# Patient Record
Sex: Male | Born: 1995 | Race: White | Hispanic: No | Marital: Single | Smoking: Never smoker
Health system: Southern US, Community
[De-identification: ages and names within clinical notes are randomized; demographics above are authoritative.]

## PROBLEM LIST (undated history)

## (undated) DIAGNOSIS — F840 Autistic disorder: Secondary | ICD-10-CM

## (undated) DIAGNOSIS — E7800 Pure hypercholesterolemia, unspecified: Secondary | ICD-10-CM

## (undated) DIAGNOSIS — Z8489 Family history of other specified conditions: Secondary | ICD-10-CM

## (undated) DIAGNOSIS — K0530 Chronic periodontitis, unspecified: Secondary | ICD-10-CM

## (undated) DIAGNOSIS — F419 Anxiety disorder, unspecified: Secondary | ICD-10-CM

## (undated) DIAGNOSIS — R231 Pallor: Secondary | ICD-10-CM

## (undated) DIAGNOSIS — K011 Impacted teeth: Secondary | ICD-10-CM

## (undated) DIAGNOSIS — K760 Fatty (change of) liver, not elsewhere classified: Secondary | ICD-10-CM

## (undated) HISTORY — PX: NO PAST SURGERIES: SHX2092

---
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
## 2013-02-27 ENCOUNTER — Emergency Department (HOSPITAL_BASED_OUTPATIENT_CLINIC_OR_DEPARTMENT_OTHER)
Admission: EM | Admit: 2013-02-27 | Discharge: 2013-02-27 | Disposition: A | Discharge status: Home / Self Care | Payer: 59 | Attending: Emergency Medicine | Admitting: Emergency Medicine

## 2013-02-27 ENCOUNTER — Emergency Department (HOSPITAL_BASED_OUTPATIENT_CLINIC_OR_DEPARTMENT_OTHER): Payer: 59

## 2013-02-27 ENCOUNTER — Encounter (HOSPITAL_BASED_OUTPATIENT_CLINIC_OR_DEPARTMENT_OTHER): Payer: Self-pay | Admitting: Emergency Medicine

## 2013-02-27 DIAGNOSIS — F411 Generalized anxiety disorder: Secondary | ICD-10-CM | POA: Insufficient documentation

## 2013-02-27 DIAGNOSIS — R109 Unspecified abdominal pain: Secondary | ICD-10-CM

## 2013-02-27 DIAGNOSIS — K3189 Other diseases of stomach and duodenum: Secondary | ICD-10-CM | POA: Insufficient documentation

## 2013-02-27 DIAGNOSIS — Z880 Allergy status to penicillin: Secondary | ICD-10-CM | POA: Insufficient documentation

## 2013-02-27 DIAGNOSIS — R630 Anorexia: Secondary | ICD-10-CM | POA: Insufficient documentation

## 2013-02-27 DIAGNOSIS — F840 Autistic disorder: Secondary | ICD-10-CM | POA: Insufficient documentation

## 2013-02-27 DIAGNOSIS — Z79899 Other long term (current) drug therapy: Secondary | ICD-10-CM | POA: Insufficient documentation

## 2013-02-27 DIAGNOSIS — R110 Nausea: Secondary | ICD-10-CM | POA: Insufficient documentation

## 2013-02-27 HISTORY — DX: Anxiety disorder, unspecified: F41.9

## 2013-02-27 HISTORY — DX: Autistic disorder: F84.0

## 2013-02-27 LAB — RAPID STREP SCREEN (MED CTR MEBANE ONLY): Streptococcus, Group A Screen (Direct): NEGATIVE

## 2013-02-27 NOTE — ED Notes (Signed)
Generalized abdominal pain since Tuesday, decreased po intake since Tuesday.

## 2013-02-27 NOTE — ED Provider Notes (Signed)
Medical screening examination/treatment/procedure(s) were performed by non-physician practitioner and as supervising physician I was immediately available for consultation/collaboration.  EKG Interpretation   None         Rolan Bucco, MD 02/27/13 1924

## 2013-02-27 NOTE — ED Provider Notes (Signed)
CSN: [id number]     Arrival date & time 02/27/13  1712 History   First MD Initiated Contact with Patient 02/27/13 1714     Chief Complaint  Patient presents with  . Abdominal Pain   (Consider location/radiation/quality/duration/timing/severity/associated sxs/prior Treatment) HPI Pt is an 17yo male hx of autism BIB mother c/o abdominal pain, decreased PO intake and fatigue.  Hx from pt is limited as he changes his answers with the same questions asked.  Pt's mother states he was c/o stomach pain on Tuesday, 11/5 and did not eat much all day. Pt seemed to sleep more than usual.  Wednesday the pt did the same.  Yesterday, he did go to school and had a "go-urt" but did not eat much the rest of the day and still sleep more than usual.  Pt saw his psychiatrist who informed mother he did not seem himself, he should be seen by a medical provider. Pt was seen at [facility] who advised mother to bring child to ED as he was c/o left sided abdominal pain.  Pt is denying pain at this time. Denies fever, n/v/d.  Mom believes pt may be making it up, thinking he is in 5th grade again as he has made several references about 5th grade recently , however, she is unsure. Denies hx of abdominal surgeries.  Past Medical History  Diagnosis Date  . Anxiety   . Autism    History reviewed. No pertinent past surgical history. History reviewed. No pertinent family history. History  Substance Use Topics  . Smoking status: Never Smoker   . Smokeless tobacco: Not on file  . Alcohol Use: No    Review of Systems  Constitutional: Negative for fever and chills.  Gastrointestinal: Positive for nausea and abdominal pain. Negative for vomiting and diarrhea.  Genitourinary: Negative for dysuria.  All other systems reviewed and are negative.    Allergies  Penicillins  Home Medications   Current Outpatient Rx  Name  Route  Sig  Dispense  Refill  . escitalopram (LEXAPRO) 10 MG tablet   Oral   Take 15 mg by mouth  daily.         . Multiple Vitamin (MULTIVITAMIN) tablet   Oral   Take 1 tablet by mouth daily.          BP 135/93  Pulse 96  Temp(Src) 97.5 F (36.4 C) (Oral)  Resp 18  Ht 5\' 10"  (1.778 m)  Wt 167 lb (75.751 kg)  BMI 23.96 kg/m2  SpO2 97% Physical Exam  Nursing note and vitals reviewed. Constitutional: He appears well-developed and well-nourished.  Pt lying on exam bed. NAD.  HENT:  Head: Normocephalic and atraumatic.  Eyes: Conjunctivae are normal. No scleral icterus.  Neck: Normal range of motion.  Cardiovascular: Normal rate, regular rhythm and normal heart sounds.   Pulmonary/Chest: Effort normal and breath sounds normal. No respiratory distress. He has no wheezes. He has no rales. He exhibits no tenderness.  Abdominal: Soft. He exhibits no distension and no mass. Bowel sounds are increased. There is no tenderness. There is no rebound and no guarding.  Increased bowel sounds. Soft, non-distended, non-tender.  Musculoskeletal: Normal range of motion.  Neurological: He is alert.  Skin: Skin is warm and dry.    ED Course  Procedures (including critical care time) Labs Review Labs Reviewed  RAPID STREP SCREEN  CULTURE, GROUP A STREP   Imaging Review Dg Abd 1 View  02/27/2013   CLINICAL DATA:  Abdominal pain  EXAM:  ABDOMEN - 1 VIEW  COMPARISON:  None.  FINDINGS: Upper abdomen is excluded. The visualized bowel gas pattern is normal. No radio-opaque calculi or other significant radiographic abnormality are seen.  IMPRESSION: Negative.   Electronically Signed   By: Oley Balm M.D.   On: 02/27/2013 17:49    EKG Interpretation   None       MDM   1. Stomach pain   2. Decreased appetite    Pt is 16yo male hx of autism c/o intermittent abdominal pain.  Mom reports increased sleeping and decreased PO in take.  Abd: increased bowel sounds, soft, NDNT.  Not concerned for surgical abdomen.  Will get KUB to check for possible constipation.  Will also get rapid strep  as pt mentioned to mother sore throat at one point.    Abd: no acute findings. Rapid strep: negative.  All labs/imaging/findings discussed with patient. All questions answered and concerns addressed. Will discharge pt home and have pt f/u with Dr. Eddie Candle. Return precautions given. Pt's mother verbalized understanding and agreement with tx plan. Vitals: unremarkable. Discharged in stable condition.    Discussed pt with attending during ED encounter and agrees with plan.     Junius Finner, PA-C 02/27/13 [phone]

## 2013-03-01 LAB — CULTURE, GROUP A STREP

## 2015-12-23 DIAGNOSIS — K0530 Chronic periodontitis, unspecified: Secondary | ICD-10-CM

## 2015-12-23 DIAGNOSIS — K011 Impacted teeth: Secondary | ICD-10-CM

## 2015-12-23 HISTORY — DX: Impacted teeth: K01.1

## 2015-12-23 HISTORY — DX: Chronic periodontitis, unspecified: K05.30

## 2015-12-28 ENCOUNTER — Encounter (HOSPITAL_BASED_OUTPATIENT_CLINIC_OR_DEPARTMENT_OTHER): Payer: Self-pay | Admitting: *Deleted

## 2015-12-29 ENCOUNTER — Encounter (HOSPITAL_BASED_OUTPATIENT_CLINIC_OR_DEPARTMENT_OTHER): Payer: Self-pay | Admitting: *Deleted

## 2015-12-29 NOTE — H&P (Signed)
  This is a 20 y/o wd/wn autistic white male.  He presents with four impacted third molars.  Indications for removal have been discussed at length.  Possible paresthesia to the  Lip tongue and chin were discussed.  Vital signs were checked.  Because of his autism and the difficulty of his surgery removal of his third molars under general anesthesia was recommended.

## 2015-12-29 NOTE — H&P (Signed)
Raymond Haynes is an 20 y.o. male.   Chief Complaint: impacted third molars  HPI: present for at least 4 years  Past Medical History:  Diagnosis Date  . Anxiety   . Autism    difficulties with food textures, per mother  . Family history of adverse reaction to anesthesia    states father woke up during wisdom tooth extraction  . Fatty liver   . High cholesterol   . Impacted third molar tooth 12/2015  . Pale complexion    mother states is fair-skin and strawberry blond hair  . 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 NO PAST SURGERIES      Family History  Problem Relation Age of Onset  . Anesthesia problems Father     woke up during wisdom tooth extraction   Social History:  reports that he has never smoked. He has never used smokeless tobacco. He reports that he does not drink alcohol or use drugs.  Allergies:  Allergies  Allergen Reactions  . Penicillins Rash    No prescriptions prior to admission.    No results found for this or any previous visit (from the past 48 hour(s)). No results found.  ROS  Height 5' 10.5" (1.791 m), weight 86.2 kg (190 lb). Physical Exam  HENT:  Mouth/Throat: Uvula is midline, oropharynx is clear and moist and mucous membranes are normal.       Assessment/Plan Four impacted third molar/autism  Raymond Haynes,JOSEPH L, DDS 12/29/2015, 4:56 PM

## 2016-01-04 ENCOUNTER — Ambulatory Visit (HOSPITAL_BASED_OUTPATIENT_CLINIC_OR_DEPARTMENT_OTHER): Payer: 59 | Admitting: Anesthesiology

## 2016-01-04 ENCOUNTER — Encounter (HOSPITAL_BASED_OUTPATIENT_CLINIC_OR_DEPARTMENT_OTHER)
Admission: RE | Disposition: A | Discharge status: Home / Self Care | Payer: Self-pay | Source: Ambulatory Visit | Attending: Oral Surgery

## 2016-01-04 ENCOUNTER — Encounter (HOSPITAL_BASED_OUTPATIENT_CLINIC_OR_DEPARTMENT_OTHER): Payer: Self-pay | Admitting: Certified Registered"

## 2016-01-04 ENCOUNTER — Ambulatory Visit (HOSPITAL_BASED_OUTPATIENT_CLINIC_OR_DEPARTMENT_OTHER)
Admission: RE | Admit: 2016-01-04 | Discharge: 2016-01-04 | Disposition: A | Discharge status: Home / Self Care | Payer: 59 | Source: Ambulatory Visit | Attending: Oral Surgery | Admitting: Oral Surgery

## 2016-01-04 DIAGNOSIS — K011 Impacted teeth: Secondary | ICD-10-CM | POA: Insufficient documentation

## 2016-01-04 DIAGNOSIS — K0530 Chronic periodontitis, unspecified: Secondary | ICD-10-CM | POA: Insufficient documentation

## 2016-01-04 DIAGNOSIS — E7800 Pure hypercholesterolemia, unspecified: Secondary | ICD-10-CM | POA: Insufficient documentation

## 2016-01-04 DIAGNOSIS — F419 Anxiety disorder, unspecified: Secondary | ICD-10-CM | POA: Diagnosis not present

## 2016-01-04 DIAGNOSIS — F840 Autistic disorder: Secondary | ICD-10-CM | POA: Diagnosis not present

## 2016-01-04 DIAGNOSIS — Z880 Allergy status to penicillin: Secondary | ICD-10-CM | POA: Insufficient documentation

## 2016-01-04 HISTORY — PX: TOOTH EXTRACTION: SHX859

## 2016-01-04 HISTORY — DX: Pallor: R23.1

## 2016-01-04 HISTORY — DX: Chronic periodontitis, unspecified: K05.30

## 2016-01-04 HISTORY — DX: Pure hypercholesterolemia, unspecified: E78.00

## 2016-01-04 HISTORY — DX: Family history of other specified conditions: Z84.89

## 2016-01-04 HISTORY — DX: Fatty (change of) liver, not elsewhere classified: K76.0

## 2016-01-04 HISTORY — DX: Impacted teeth: K01.1

## 2016-01-04 SURGERY — EXTRACTION, TOOTH, MOLAR
Anesthesia: General | Site: Mouth

## 2016-01-04 MED ORDER — OXYMETAZOLINE HCL 0.05 % NA SOLN
NASAL | Status: DC | PRN
  Administered 2016-01-04 (×2): 2 via NASAL

## 2016-01-04 MED ORDER — BACITRACIN-NEOMYCIN-POLYMYXIN 400-5-5000 EX OINT
TOPICAL_OINTMENT | CUTANEOUS | Status: AC
  Filled 2016-01-04: qty 1

## 2016-01-04 MED ORDER — LACTATED RINGERS IV SOLN
INTRAVENOUS | Status: DC
  Administered 2016-01-04: 08:00:00 via INTRAVENOUS

## 2016-01-04 MED ORDER — ONDANSETRON HCL 4 MG/2ML IJ SOLN
INTRAMUSCULAR | Status: DC | PRN
  Administered 2016-01-04: 4 mg via INTRAVENOUS

## 2016-01-04 MED ORDER — CHLORHEXIDINE GLUCONATE CLOTH 2 % EX PADS: 6.0000 | MEDICATED_PAD | Freq: Once | CUTANEOUS | Status: DC

## 2016-01-04 MED ORDER — LIDOCAINE 2% (20 MG/ML) 5 ML SYRINGE
INTRAMUSCULAR | Status: AC
  Filled 2016-01-04: qty 5

## 2016-01-04 MED ORDER — FENTANYL CITRATE (PF) 100 MCG/2ML IJ SOLN
50.0000 ug | INTRAMUSCULAR | Status: DC | PRN
  Administered 2016-01-04: 100 ug via INTRAVENOUS

## 2016-01-04 MED ORDER — LIDOCAINE-EPINEPHRINE 2 %-1:100000 IJ SOLN
INTRAMUSCULAR | Status: AC
  Filled 2016-01-04: qty 1.7

## 2016-01-04 MED ORDER — ARTIFICIAL TEARS OP OINT
TOPICAL_OINTMENT | OPHTHALMIC | Status: AC
  Filled 2016-01-04: qty 3.5

## 2016-01-04 MED ORDER — MIDAZOLAM HCL 2 MG/2ML IJ SOLN: 1.0000 mg | INTRAMUSCULAR | Status: DC | PRN

## 2016-01-04 MED ORDER — PROPOFOL 500 MG/50ML IV EMUL
INTRAVENOUS | Status: AC
  Filled 2016-01-04: qty 50

## 2016-01-04 MED ORDER — ONDANSETRON HCL 4 MG/2ML IJ SOLN
INTRAMUSCULAR | Status: AC
  Filled 2016-01-04: qty 2

## 2016-01-04 MED ORDER — SCOPOLAMINE 1 MG/3DAYS TD PT72: 1.0000 | MEDICATED_PATCH | Freq: Once | TRANSDERMAL | Status: DC | PRN

## 2016-01-04 MED ORDER — ATROPINE SULFATE 0.4 MG/ML IJ SOLN
INTRAMUSCULAR | Status: AC
  Filled 2016-01-04: qty 1

## 2016-01-04 MED ORDER — DEXAMETHASONE SODIUM PHOSPHATE 4 MG/ML IJ SOLN
INTRAMUSCULAR | Status: DC | PRN
  Administered 2016-01-04: 10 mg via INTRAVENOUS

## 2016-01-04 MED ORDER — LIDOCAINE-EPINEPHRINE 2 %-1:100000 IJ SOLN
INTRAMUSCULAR | Status: AC
  Filled 2016-01-04: qty 5.1

## 2016-01-04 MED ORDER — GLYCOPYRROLATE 0.2 MG/ML IJ SOLN: 0.2000 mg | Freq: Once | INTRAMUSCULAR | Status: DC | PRN

## 2016-01-04 MED ORDER — CEFAZOLIN SODIUM-DEXTROSE 2-4 GM/100ML-% IV SOLN
INTRAVENOUS | Status: AC
  Filled 2016-01-04: qty 100

## 2016-01-04 MED ORDER — DEXAMETHASONE SODIUM PHOSPHATE 10 MG/ML IJ SOLN
INTRAMUSCULAR | Status: AC
  Filled 2016-01-04: qty 1

## 2016-01-04 MED ORDER — BUPIVACAINE-EPINEPHRINE (PF) 0.5% -1:200000 IJ SOLN
INTRAMUSCULAR | Status: AC
  Filled 2016-01-04: qty 5.4

## 2016-01-04 MED ORDER — FENTANYL CITRATE (PF) 100 MCG/2ML IJ SOLN: 25.0000 ug | INTRAMUSCULAR | Status: DC | PRN

## 2016-01-04 MED ORDER — LIDOCAINE HCL (CARDIAC) 20 MG/ML IV SOLN
INTRAVENOUS | Status: DC | PRN
  Administered 2016-01-04: 60 mg via INTRAVENOUS

## 2016-01-04 MED ORDER — SUCCINYLCHOLINE CHLORIDE 20 MG/ML IJ SOLN
INTRAMUSCULAR | Status: DC | PRN
  Administered 2016-01-04: 120 mg via INTRAVENOUS

## 2016-01-04 MED ORDER — CEFAZOLIN SODIUM-DEXTROSE 2-4 GM/100ML-% IV SOLN
2.0000 g | INTRAVENOUS | Status: AC
  Administered 2016-01-04: 2 g via INTRAVENOUS

## 2016-01-04 MED ORDER — OXYMETAZOLINE HCL 0.05 % NA SOLN
NASAL | Status: AC
  Filled 2016-01-04: qty 15

## 2016-01-04 MED ORDER — SUCCINYLCHOLINE CHLORIDE 200 MG/10ML IV SOSY
PREFILLED_SYRINGE | INTRAVENOUS | Status: AC
  Filled 2016-01-04: qty 10

## 2016-01-04 MED ORDER — PROPOFOL 10 MG/ML IV BOLUS
INTRAVENOUS | Status: DC | PRN
  Administered 2016-01-04: 100 mg via INTRAVENOUS
  Administered 2016-01-04: 150 mg via INTRAVENOUS

## 2016-01-04 MED ORDER — LIDOCAINE-EPINEPHRINE 2 %-1:100000 IJ SOLN
INTRAMUSCULAR | Status: DC | PRN
Start: 2016-01-04 — End: 2016-01-04
  Administered 2016-01-04: 6.8 mL via INTRADERMAL

## 2016-01-04 MED ORDER — PROMETHAZINE HCL 25 MG/ML IJ SOLN: 6.2500 mg | INTRAMUSCULAR | Status: DC | PRN

## 2016-01-04 MED ORDER — FENTANYL CITRATE (PF) 100 MCG/2ML IJ SOLN
INTRAMUSCULAR | Status: AC
  Filled 2016-01-04: qty 2

## 2016-01-04 SURGICAL SUPPLY — 35 items
BLADE SURG 15 STRL LF DISP TIS (BLADE) ×1 IMPLANT
BLADE SURG 15 STRL SS (BLADE) ×2
BNDG COHESIVE 4X5 TAN STRL (GAUZE/BANDAGES/DRESSINGS) ×3 IMPLANT
BUR OVAL 4.0MMX59MM (BURR)
BUR OVAL 4.0X59 (BURR) IMPLANT
CANISTER SUCT 1200ML W/VALVE (MISCELLANEOUS) ×3 IMPLANT
CATH ROBINSON RED A/P 10FR (CATHETERS) IMPLANT
COVER BACK TABLE 60X90IN (DRAPES) ×3 IMPLANT
COVER MAYO STAND STRL (DRAPES) ×3 IMPLANT
DRAPE U-SHAPE 76X120 STRL (DRAPES) ×3 IMPLANT
GAUZE PACKING IODOFORM 1/4X15 (GAUZE/BANDAGES/DRESSINGS) IMPLANT
GLOVE BIO SURGEON STRL SZ 6.5 (GLOVE) ×6 IMPLANT
GLOVE BIO SURGEON STRL SZ7.5 (GLOVE) ×3 IMPLANT
GLOVE BIO SURGEONS STRL SZ 6.5 (GLOVE) ×3
GOWN STRL REUS W/ TWL LRG LVL3 (GOWN DISPOSABLE) ×2 IMPLANT
GOWN STRL REUS W/ TWL XL LVL3 (GOWN DISPOSABLE) ×1 IMPLANT
GOWN STRL REUS W/TWL LRG LVL3 (GOWN DISPOSABLE) ×4
GOWN STRL REUS W/TWL XL LVL3 (GOWN DISPOSABLE) ×2
IV NS 500ML (IV SOLUTION) ×2
IV NS 500ML BAXH (IV SOLUTION) ×1 IMPLANT
NEEDLE DENTAL 27 LONG (NEEDLE) ×3 IMPLANT
NS IRRIG 1000ML POUR BTL (IV SOLUTION) ×3 IMPLANT
PACK BASIN DAY SURGERY FS (CUSTOM PROCEDURE TRAY) ×3 IMPLANT
SPONGE SURGIFOAM ABS GEL 12-7 (HEMOSTASIS) IMPLANT
SUT CHROMIC 3 0 PS 2 (SUTURE) ×3 IMPLANT
SUT CHROMIC 4 0 P 3 18 (SUTURE) IMPLANT
SUT SILK 3 0 PS 1 (SUTURE) IMPLANT
SYR 50ML LL SCALE MARK (SYRINGE) ×6 IMPLANT
TOOTHBRUSH ADULT (PERSONAL CARE ITEMS) ×3 IMPLANT
TOWEL OR 17X24 6PK STRL BLUE (TOWEL DISPOSABLE) ×6 IMPLANT
TOWEL OR NON WOVEN STRL DISP B (DISPOSABLE) ×3 IMPLANT
TUBE CONNECTING 20'X1/4 (TUBING) ×1
TUBE CONNECTING 20X1/4 (TUBING) ×2 IMPLANT
VENT IRR SPI W TUB AD (MISCELLANEOUS) ×3 IMPLANT
YANKAUER SUCT BULB TIP NO VENT (SUCTIONS) ×3 IMPLANT

## 2016-01-04 NOTE — Anesthesia Preprocedure Evaluation (Signed)
Anesthesia Evaluation  Patient identified by MRN, date of birth, ID band Patient awake    Reviewed: Allergy & Precautions, NPO status , Patient's Chart, lab work & pertinent test results  Airway Mallampati: II  TM Distance: >3 FB Neck ROM: Full    Dental no notable dental hx.    Pulmonary neg pulmonary ROS,    Pulmonary exam normal breath sounds clear to auscultation       Cardiovascular negative cardio ROS Normal cardiovascular exam Rhythm:Regular Rate:Normal     Neuro/Psych Anxiety negative neurological ROS     GI/Hepatic negative GI ROS, Neg liver ROS,   Endo/Other  negative endocrine ROS  Renal/GU negative Renal ROS  negative genitourinary   Musculoskeletal negative musculoskeletal ROS (+)   Abdominal   Peds negative pediatric ROS (+)  Hematology negative hematology ROS (+)   Anesthesia Other Findings   Reproductive/Obstetrics negative OB ROS                             Anesthesia Physical Anesthesia Plan  ASA: II  Anesthesia Plan: General   Post-op Pain Management:    Induction: Intravenous  Airway Management Planned: Nasal ETT  Additional Equipment:   Intra-op Plan:   Post-operative Plan: Extubation in OR  Informed Consent: I have reviewed the patients History and Physical, chart, labs and discussed the procedure including the risks, benefits and alternatives for the proposed anesthesia with the patient or authorized representative who has indicated his/her understanding and acceptance.   Dental advisory given  Plan Discussed with: CRNA and Surgeon  Anesthesia Plan Comments:         Anesthesia Quick Evaluation

## 2016-01-04 NOTE — Anesthesia Procedure Notes (Signed)
Procedure Name: Intubation Performed by: Baxter Flattery Pre-anesthesia Checklist: Patient identified, Emergency Drugs available, Suction available and Patient being monitored Patient Re-evaluated:Patient Re-evaluated prior to inductionOxygen Delivery Method: Circle system utilized Preoxygenation: Pre-oxygenation with 100% oxygen Intubation Type: IV induction Ventilation: Mask ventilation without difficulty Laryngoscope Size: Mac, 4, Miller and 2 Grade View: Grade III Nasal Tubes: Nasal prep performed, Nasal Rae, Right and Magill forceps- large, utilized Tube size: 7.0 mm Placement Confirmation: ETT inserted through vocal cords under direct vision,  positive ETCO2 and breath sounds checked- equal and bilateral Secured at: 25 cm Tube secured with: Tape Dental Injury: Teeth and Oropharynx as per pre-operative assessment  Difficulty Due To: Difficult Airway- due to anterior larynx and Difficult Airway- due to large tongue

## 2016-01-04 NOTE — Anesthesia Postprocedure Evaluation (Signed)
Anesthesia Post Note  Patient: Raymond Haynes  Procedure(s) Performed: Procedure(s) (LRB): SURGICAL REMOVAL OF IMPACTED THIRD MOLARS (N/A)  Patient location during evaluation: PACU Anesthesia Type: General Level of consciousness: awake and alert Pain management: pain level controlled Vital Signs Assessment: post-procedure vital signs reviewed and stable Respiratory status: spontaneous breathing, nonlabored ventilation, respiratory function stable and patient connected to nasal cannula oxygen Cardiovascular status: blood pressure returned to baseline and stable Postop Assessment: no signs of nausea or vomiting Anesthetic complications: no    Last Vitals:  Vitals:   01/04/16 1000 01/04/16 1015  BP: (!) 155/104 (!) 158/107  Pulse: 94 95  Resp: 14 16  Temp:      Last Pain:  Vitals:   01/04/16 0945  TempSrc:   PainSc: 0-No pain                 Qamar Aughenbaugh S

## 2016-01-04 NOTE — H&P (Signed)
There is no change in the medical history.  The surgery was reviewed with the family.  Possible paresthesia to the lip tongue and chin was discussed.  Post op instructions was also discussed.

## 2016-01-04 NOTE — Discharge Instructions (Signed)
Please see Dr. Ammie Ferrier written instruction sheet. Follow up appointment as scheduled.   Post Anesthesia Home Care Instructions  Activity: Get plenty of rest for the remainder of the day. A responsible adult should stay with you for 24 hours following the procedure.  For the next 24 hours, DO NOT: -Drive a car -Paediatric nurse -Drink alcoholic beverages -Take any medication unless instructed by your physician -Make any legal decisions or sign important papers.  Meals: Start with liquid foods such as gelatin or soup. Progress to regular foods as tolerated. Avoid greasy, spicy, heavy foods. If nausea and/or vomiting occur, drink only clear liquids until the nausea and/or vomiting subsides. Call your physician if vomiting continues.  Special Instructions/Symptoms: Your throat may feel dry or sore from the anesthesia or the breathing tube placed in your throat during surgery. If this causes discomfort, gargle with warm salt water. The discomfort should disappear within 24 hours.  If you had a scopolamine patch placed behind your ear for the management of post- operative nausea and/or vomiting:  1. The medication in the patch is effective for 72 hours, after which it should be removed.  Wrap patch in a tissue and discard in the trash. Wash hands thoroughly with soap and water. 2. You may remove the patch earlier than 72 hours if you experience unpleasant side effects which may include dry mouth, dizziness or visual disturbances. 3. Avoid touching the patch. Wash your hands with soap and water after contact with the patch.

## 2016-01-04 NOTE — Brief Op Note (Signed)
01/04/2016  9:35 AM  PATIENT:  Raymond Haynes  20 y.o. male  PRE-OPERATIVE DIAGNOSIS:  autism  pericoronitis of impacted thrid molars  POST-OPERATIVE DIAGNOSIS:  autism  pericoronitis of impacted thrid molars  PROCEDURE:  Procedure(s): SURGICAL REMOVAL OF IMPACTED THIRD MOLARS (N/A)  SURGEON:  Surgeon(s) and Role:    * Jannette Fogo, DDS - Primary  PHYSICIAN ASSISTANT:   ASSISTANTS: Jilda Panda, Thea Alken Brewer   ANESTHESIA:   general  EBL:  Total I/O In: 1000 [I.V.:1000] Out: 100 [Blood:100]  BLOOD ADMINISTERED:none  DRAINS: none   LOCAL MEDICATIONS USED:  XYLOCAINE   SPECIMEN:  No Specimen  DISPOSITION OF SPECIMEN:  N/A  COUNTS:  YES  TOURNIQUET:  * No tourniquets in log *  DICTATION: .Dragon Dictation  PLAN OF CARE: Discharge to home after PACU  PATIENT DISPOSITION:  PACU - hemodynamically stable.   Delay start of Pharmacological VTE agent (>24hrs) due to surgical blood loss or risk of bleeding: not applicable

## 2016-01-04 NOTE — Op Note (Signed)
The patient was brought to the operating room and placed in the supine position and which she remained throughout the whole procedure. He was intubated by right nasoendotracheal tube. A timeout was performed. He was prepped and draped in usual fashion for an intraoral procedure. For carpules of 2% Xylocaine with 1-100,000 epinephrine was then given as a bilateral block and infiltration of the right and left maxilla. #15 blade made an incision over the left tuberosity extending to #14. A periosteal elevator reflected a full-thickness mucoperiosteal flap. The bone covering #16 was removed with a round bur and copious irrigation. Once the tooth could be visualized it was elevated using a #92 elevator. It was removed from the socket. The socket was trimmed with a rongeur and smoothed off with a bone file. The socket was curetted and closed with 3-0 chromic sutures. A #15 blade made an incision over the left retromolar pad. There was a small release created at the distal buccal aspect of #18. A full-thickness mucoperiosteal flap was elevated with a periosteal elevator. Occlusal buccal and distal bone was removed with a round bur and copious irrigation. The tooth was sectioned longitudinally and split with an 11-elevator. The tooth was removed with an 11-A elevator at a rongeur. The sockets were curetted and irrigated. The soft tissue was closed with a 3-0 chromic suture. A #15 blade made an incision over the right retromolar pad. There was a small release at the distal buccal aspect of #31.. A full-thickness mucoperiosteal flap was elevated with a periosteal elevator. Occlusal buccal and distal bone was removed with a round bur and copious irrigation. The tooth was sectioned longitudinally and split with an 11-elevator. The tooth was removed with an 11-A elevator at a rongeur. The sockets were curetted and irrigated. The soft tissue was closed with a 3-0 chromic suture. A #15 blade made an incision over the right  tuberosity. A full-thickness mucoperiosteal flap elevated a full-thickness flap extending from #2 to #1. Occlusal buccal and distal bone was removed with a rongeur and a round bur and copious irrigation. The tooth was visualized and elevated out of the socket using a Estate manager/land agent. The socket was trimmed and curetted. The soft tissue was closed with a 3-0 chromic suture. There was good hemostasis. The mouth was suctioned out the throat pack was removed. The areas were packed off and the patient was extubated on the table and returned to the recovery room in good condition. He'll be followed by me in my private office.

## 2016-01-04 NOTE — Transfer of Care (Signed)
Immediate Anesthesia Transfer of Care Note  Patient: Raymond Haynes  Procedure(s) Performed: Procedure(s): SURGICAL REMOVAL OF IMPACTED THIRD MOLARS (N/A)  Patient Location: PACU  Anesthesia Type:General  Level of Consciousness: awake, alert  and patient cooperative  Airway & Oxygen Therapy: Patient Spontanous Breathing and Patient connected to face mask oxygen  Post-op Assessment: Report given to RN, Post -op Vital signs reviewed and stable and Patient moving all extremities  Post vital signs: Reviewed and stable  Last Vitals:  Vitals:   01/04/16 0741  BP: 130/84  Pulse: 93  Resp: 18  Temp: 36.4 C    Last Pain:  Vitals:   01/04/16 0741  TempSrc: Oral      Patients Stated Pain Goal: 0 ([id number])  Complications: No apparent anesthesia complications

## 2016-01-06 ENCOUNTER — Encounter (HOSPITAL_BASED_OUTPATIENT_CLINIC_OR_DEPARTMENT_OTHER): Payer: Self-pay | Admitting: Oral Surgery

## 2016-05-15 DIAGNOSIS — D2272 Melanocytic nevi of left lower limb, including hip: Secondary | ICD-10-CM | POA: Diagnosis not present

## 2016-05-15 DIAGNOSIS — D225 Melanocytic nevi of trunk: Secondary | ICD-10-CM | POA: Diagnosis not present

## 2016-05-15 DIAGNOSIS — D2262 Melanocytic nevi of left upper limb, including shoulder: Secondary | ICD-10-CM | POA: Diagnosis not present

## 2016-06-08 DIAGNOSIS — R740 Nonspecific elevation of levels of transaminase and lactic acid dehydrogenase [LDH]: Secondary | ICD-10-CM | POA: Diagnosis not present

## 2016-06-08 DIAGNOSIS — E781 Pure hyperglyceridemia: Secondary | ICD-10-CM | POA: Diagnosis not present

## 2016-06-08 DIAGNOSIS — E784 Other hyperlipidemia: Secondary | ICD-10-CM | POA: Diagnosis not present

## 2016-11-14 DIAGNOSIS — R7309 Other abnormal glucose: Secondary | ICD-10-CM | POA: Diagnosis not present

## 2016-11-14 DIAGNOSIS — E784 Other hyperlipidemia: Secondary | ICD-10-CM | POA: Diagnosis not present

## 2016-11-14 DIAGNOSIS — Z0000 Encounter for general adult medical examination without abnormal findings: Secondary | ICD-10-CM | POA: Diagnosis not present

## 2016-11-20 DIAGNOSIS — E781 Pure hyperglyceridemia: Secondary | ICD-10-CM | POA: Diagnosis not present

## 2016-11-20 DIAGNOSIS — Z0000 Encounter for general adult medical examination without abnormal findings: Secondary | ICD-10-CM | POA: Diagnosis not present

## 2016-11-20 DIAGNOSIS — B369 Superficial mycosis, unspecified: Secondary | ICD-10-CM | POA: Diagnosis not present

## 2016-11-20 DIAGNOSIS — Z1389 Encounter for screening for other disorder: Secondary | ICD-10-CM | POA: Diagnosis not present

## 2016-11-20 DIAGNOSIS — R740 Nonspecific elevation of levels of transaminase and lactic acid dehydrogenase [LDH]: Secondary | ICD-10-CM | POA: Diagnosis not present

## 2016-12-18 DIAGNOSIS — R945 Abnormal results of liver function studies: Secondary | ICD-10-CM | POA: Diagnosis not present

## 2016-12-19 DIAGNOSIS — R740 Nonspecific elevation of levels of transaminase and lactic acid dehydrogenase [LDH]: Secondary | ICD-10-CM | POA: Diagnosis not present

## 2017-01-23 ENCOUNTER — Encounter (INDEPENDENT_AMBULATORY_CARE_PROVIDER_SITE_OTHER): Payer: Self-pay | Admitting: Pediatrics

## 2017-01-23 ENCOUNTER — Ambulatory Visit (INDEPENDENT_AMBULATORY_CARE_PROVIDER_SITE_OTHER): Payer: 59 | Admitting: Pediatrics

## 2017-01-23 VITALS — BP 120/76 | HR 104 | Ht 70.5 in | Wt 213.0 lb

## 2017-01-23 DIAGNOSIS — R4689 Other symptoms and signs involving appearance and behavior: Secondary | ICD-10-CM

## 2017-01-23 DIAGNOSIS — F840 Autistic disorder: Secondary | ICD-10-CM | POA: Insufficient documentation

## 2017-01-23 DIAGNOSIS — E661 Drug-induced obesity: Secondary | ICD-10-CM

## 2017-01-23 NOTE — Progress Notes (Addendum)
Patient: Raymond Haynes MRN: [id number] Sex: male DOB: 09-Aug-1995  Provider: Carylon Perches, MD Location of Care: [facility]  Note type: New patient consultation  History of Present Illness: Referral Source: Dr. Jim Like History from: mother, patient and referring office Chief Complaint: Autism  Raymond Haynes is a 21 y.o. male with history of autism, anxiety, HLD who presents for evaluation of the same. Review of prior history shows he last saw his PCP 01/23/2017 with HLD, fatty liver.  Labs reviewed and showing elevated AST, ALT, triglycerides.  TSH normal, A1C normal.  Ferritin normal. Mother reported interest in testing for how he processes medication and find medications that work best for him, stating she is feeling overwhelmed.    Patient presents today with mother.  She reports desire for someone to help manage his medications and comfortable with patients with spceial needs.  Previously saw Dr Delton See until he aged out, now seeing Dr Ardeth Perfect who is very good but having difficulty with managing medications between him and his psychiatrist, Dr Clovis Pu. NOw that he is over 19 and out of school, mother feels she doesn't know what to do for Raymond Haynes.    Evaluaton/Therapies: Mother first concerned at 18 months, he was late to talk and had a lot of stemming behavior.  He was diagnosed at 21yo by [facility].  Started at [employer]. He had an evaluation, diagnosed PDD-NOS.   He received speech and OT, stayed in self contained through all of school.  At 21yo, had a lot of aggressive behavior and self-injurious behavior.  Now, still bites himself.  Threatens both parents, occasionally hits mother.  Hits, kicks, bites.Usually remorseful afterwards.  Have been working on self-regulation with Dr Florene Glen, psychologist. He has never done private therapy for OT.  No ABA therapy, [facility], PEC.    He has seen Dr America Brown, did Fragile X and Karyotype which were normal.    Cognitive:  mild MR, delayed in math, reading, comprehension.    Medication:  On lexapro, paliperidone for behavior and mood. Previously on  Prozac, clonidine, risperdal.  Now taking lipitor for increase liver enzymes and cholesterol.  Never tried abilify, seroquel, etc.     Behavior: Continues to have some aggressive behavior, hits and pulls hair but does not injure.  Mother now ignores. Limited diet, severe sensory issues. Seeing Dr Florene Glen for counseling. Does not sound like they've ever done any parenting therapy. Limited by insurance.     Services:   On innovations waiver waitlist for 9 years.   He has [facility] respite, in process of changing facilities.  7 hours a week available, but arent' consistently using it.   Just graduated in May.  Did [employer], arts the last year, now volunteering there.  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  Going to [employer] and doing pottery class.    Autism Society- started going to autism supper club.    Has a girlfriend on the spectrum.  Parents are in communication.    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    Sleep:  Always a good sleeper.  Falls asleep easily, stays asleep throughout the night, wakes himself up.   Legal: Parents have guardianship.  Have a special needs trust and paternal twin brother and wife have physical custody and friend with financial custody.     Review of Systems: 12 system review was remarkable for anxiety, change in appetite, OCD  Past Medical History Past Medical History:  Diagnosis Date  . Anxiety   . Autism    difficulties with food textures, per mother  . Family history of adverse reaction to anesthesia    states father woke up during wisdom tooth extraction  . Fatty liver   . High cholesterol   . Impacted third molar tooth 12/2015  . Pale complexion    mother states is fair-skin and strawberry blond hair  . Pericoronitis 12/2015   third molars    Birth and Developmental History Pregnancy was  uncomplicated Delivery was complicated by hypertension.  Required c-section.   Nursery Course was uncomplicated Early Growth and Development was recalled as  normal Milestones: Early milestones until 18 months.    Surgical History Past Surgical History:  Procedure Laterality Date  . NO PAST SURGERIES    . TOOTH EXTRACTION N/A 01/04/2016   Procedure: SURGICAL REMOVAL OF IMPACTED THIRD MOLARS;  Surgeon: Jannette Fogo, DDS;  Location: [facility];  Service: Oral Surgery;  Laterality: N/A;    Family History family history includes ADD / ADHD in his paternal uncle; Anesthesia problems in his father; Anxiety disorder in his mother; Autism spectrum disorder in his paternal uncle; Depression in his mother.  Paternal grandfather on spectrum per mother.  Brother with high functioning autism spectrum disorder. Hypothyroidism.    Mother seeing counselor.  Now off medication.  Exercising.   Dad is doing well.    Social History Social History   Social History Narrative   Raymond Haynes graduated from [employer]; he volunteers at [employer] and [employer]. He is waiting on a job at [employer]. He lives with his parents and sibling. He enjoys going out to restaurants, watching movies, and listening to music.     Allergies Allergies  Allergen Reactions  . Penicillins Rash    Medications Current Outpatient Prescriptions on File Prior to Visit  Medication Sig Dispense Refill  . atorvastatin (LIPITOR) 20 MG tablet Take 20 mg by mouth daily.    . cholecalciferol (VITAMIN D) 1000 units tablet Take 1,000 Units by mouth daily.    Marland Kitchen escitalopram (LEXAPRO) 20 MG tablet Take 20 mg by mouth daily.    . metFORMIN (GLUCOPHAGE) 500 MG tablet Take 500 mg by mouth 2 (two) times daily with a meal.     . Multiple Vitamin (MULTIVITAMIN) tablet Take 1 tablet by mouth daily.    . Omega-3 Fatty Acids (FISH OIL PO) Take by mouth.    . paliperidone (INVEGA) 3 MG 24 hr tablet Take 3 mg by mouth  daily.    . vitamin B-12 (CYANOCOBALAMIN) 100 MCG tablet Take 100 mcg by mouth daily.     No current facility-administered medications on file prior to visit.    The medication list was reviewed and reconciled. All changes or newly prescribed medications were explained.  A complete medication list was provided to the patient/caregiver.  Physical Exam BP 120/76   Pulse (!) 104   Ht 5' 10.5" (1.791 m)   Wt 213 lb (96.6 kg)   BMI 30.13 kg/m   Gen: well appearing adult, light haired and fair skinned Skin: No rash, No neurocutaneous stigmata. HEENT: Normocephalic, no dysmorphic features, no conjunctival injection, nares patent, mucous membranes moist, oropharynx clear. Neck: Supple, no meningismus. No focal tenderness. Resp: Clear to auscultation bilaterally CV: Regular rate, normal S1/S2, no murmurs, no rubs Abd: BS present, abdomen soft, non-tender, non-distended. No hepatosplenomegaly or mass Ext: Warm and well-perfused. No deformities, no muscle wasting, ROM full.  Neurological Examination:  MS: Awake, alert,engaged with myself and mother.  Autistic behaviors include stemming, concrete thinking, inattentiveness to context. Follows commands well.  Cranial Nerves: Pupils were equal and reactive to light; visual field full with looking for objectst; EOM normal, no nystagmus; no ptsosis, intact facial sensation, face symmetric with full strength of facial muscles, hearing intact to finger rub bilaterally, palate elevation is symmetric, tongue protrusion is symmetric with full movement to both sides.  Sternocleidomastoid and trapezius are with normal strength. Motor-Normal tone throughout, Normal strength in all muscle groups. No abnormal movements Reflexes- Reflexes 2+ and symmetric in the biceps, triceps, patellar and achilles tendon. Plantar responses flexor bilaterally, no clonus noted Sensation: Intact to light touch throughout.  Romberg negative. Coordination: No dysmetria on FTN test.  No difficulty with balance when standing on one foot bilaterally.   Gait: Normal gait. Unable to complete tandem gait. Was able to perform toe walking and heel walking without difficulty.  Assessment and Plan Darius Lundberg is a 21 y.o. male with history of autism who presents with concerns for medications related to behavior and mood, as well as confusion and difficulty with community resources.  I reassured mother that he is largely hooked in with services, she has respite, legal and financial pieces in place, and he is finding employment.  The innovations waiver will be helpful for him and it will hopefully be available for him soon.  He should qualify for [facility] under behavioral health for further help in case management, so will refer there. Regarding medications, he has had aggression that sounds like has been difficult to treat and he has unfortunately had presumed side effects to medication including obesity, hyperlipidemia and transaminitis.   Discussed genetic testing for pharmacogenetics to better direct his medication regimen, however did counsel that it helps to determine potential poor matches but does not tell us specifically what medications to try.  Mother would like to proceed with this testing, which we can complete today through oral swab.  He is currently on paliperidone, having not taken other more common second generation antipsychotics, I expect because of the concern for HDL.  I doubt his prozac is contributing to his metabolic syndrome, but will also see how he metabolizes this on the genetic panel.    He and mother have had limited access to therapy treatments that are now more common (occupational therapy, feeding therapy, applied behavioral analysis, parent training).  I am unsure on what he may qualify for now that he is over 21.  At next appointment, we can discuss this further and potentially try these therapies that more directly address primary symptoms of autism.     Continue current medications  [facility] pharmacogenetic testing completed today and sent.  Expected turn around 6-8 weeks.   Referred to [facility] for diagnosis of autism and agitation  Orders Placed This Encounter  Procedures  . CMA genetic testing ([facility])    Order Specific Question:   Resulting Lab Name:    Answer:   [facility]  . AMB Referral Child Developmental Service    Referral Priority:   Routine    Referral Type:   Consultation    Number of Visits Requested:   1    I spend 60 minutes in consultation with the patient and family.  Greater than 50% was spent in counseling and coordination of care with the patient.    Return in about 2 months (around 03/25/2017).  Carylon Perches MD MPH Neurology [facility]  [phone]  [address] [place], [place] [address] Phone: [phone]

## 2017-01-28 ENCOUNTER — Telehealth (INDEPENDENT_AMBULATORY_CARE_PROVIDER_SITE_OTHER): Payer: Self-pay | Admitting: Pediatrics

## 2017-01-30 NOTE — Telephone Encounter (Signed)
Paperwork signed and put on Faby's desk.  Faby, please print off patient's note and sent that in with paperwork.    Carylon Perches MD MPH

## 2017-01-30 NOTE — Telephone Encounter (Signed)
4 page fax received from [facility] @ [facility], requesting Dr. Rogers Blocker to send a Letter of Medical Necessity along with medical records.  Fax: [facility] @ [place]          (F) [phone]   Fax has been labeled and placed in Dr. Shelby Mattocks office in her tray.

## 2017-01-30 NOTE — Telephone Encounter (Signed)
Documents faxed and confirmed to [facility]

## 2017-03-27 ENCOUNTER — Encounter (INDEPENDENT_AMBULATORY_CARE_PROVIDER_SITE_OTHER): Payer: Self-pay | Admitting: Pediatrics

## 2017-03-27 ENCOUNTER — Ambulatory Visit (INDEPENDENT_AMBULATORY_CARE_PROVIDER_SITE_OTHER): Payer: 59 | Admitting: Pediatrics

## 2017-03-27 VITALS — BP 118/70 | HR 84 | Ht 70.5 in | Wt 215.8 lb

## 2017-03-27 DIAGNOSIS — E661 Drug-induced obesity: Secondary | ICD-10-CM | POA: Diagnosis not present

## 2017-03-27 DIAGNOSIS — F840 Autistic disorder: Secondary | ICD-10-CM | POA: Diagnosis not present

## 2017-03-27 DIAGNOSIS — R4689 Other symptoms and signs involving appearance and behavior: Secondary | ICD-10-CM

## 2017-03-27 NOTE — Patient Instructions (Signed)
Dr Deon Pilling at [facility]  Amantadine capsules or tablets What is this medicine? AMANTADINE (a MAN ta deen) is an antiviral medicine. It is used to prevent and to treat a specific type of flu called influenza A. It will not work for colds, other types of flu, or other viral infections. This medicine is also used to treat Parkinson's disease and other movement disorders. This medicine may be used for other purposes; ask your health care provider or pharmacist if you have questions. COMMON BRAND NAME(S): Symmetrel What should I tell my health care provider before I take this medicine? They need to know if you have any of these conditions: -depression or other mental illness -eczema -glaucoma -heart failure -if you drink alcohol -kidney disease -low blood pressure -narcolepsy -seizures -sleep apnea -suicidal thoughts, plans, or attempt; a previous suicide attempt by you or a family member -an unusual or allergic reaction to amantadine, other medicines, foods, dyes, or preservatives -pregnant or trying to get pregnant -breast-feeding How should I use this medicine? Take this medicine by mouth with a full glass of water. Follow the directions on the prescription label. Take your medicine at regular intervals. Do not take your medicine more often than directed. Take all of your medicine as directed even if you think your are better. Do not skip doses or stop your medicine early. Contact your pediatrician or health care professional regarding the useof this medicine in children. While this drug may be prescribed for children as young as 49 year old for selected conditions, precautions do apply. Patients over 89 years old may have a stronger reaction and need a smaller dose. Overdosage: If you think you have taken too much of this medicine contact a poison control center or emergency room at once. NOTE: This medicine is only for you. Do not share this  medicine with others. What if I miss a dose? If you miss a dose, take it as soon as you can. If it is almost time for your next dose, take only that dose. Do not take double or extra doses. What may interact with this medicine? -acetazolamide -alcohol -atropine -antihistamines for allergy, cough and cold -benztropine -bupropion -certain medicines for bladder problems like oxybutynin, tolterodine -certain medicines for stomach problems like dicyclomine, hyoscyamine -certain medicines for travel sickness like scopolamine -ipratropium -methazolamide -quinidine -quinine -sodium bicarbonate -some flu vaccines -thioridazine -trihexyphenidyl This list may not describe all possible interactions. Give your health care provider a list of all the medicines, herbs, non-prescription drugs, or dietary supplements you use. Also tell them if you smoke, drink alcohol, or use illegal drugs. Some items may interact with your medicine. What should I watch for while using this medicine? Tell your doctor or health care professional if your symptoms do not improve. You may get drowsy or dizzy. Do not drive, use machinery, or do anything that needs mental alertness until you know how this medicine affects you. Do not stand or sit up quickly, especially if you are an older patient. This reduces the risk of dizzy or fainting spells. Alcohol may interfere with the effect of this medicine. Avoid alcoholic drinks. If you are taking this medicine for Parkinson's disease or a movement disorder, be careful. Slowly increase your daily activities as your condition improves. Do not suddenly stop taking your medicine because you may develop a severe reaction. You may get dry mouth or eyes, or blurry vision while taking this medicine. Try sugarless gum or hard candy,  and drink 6 to 8 glasses of water daily. Brush and floss your teeth regularly and carefully to avoid teeth and gum problems. You may want to wet your eyes with  lubricating eye drops. Talk to your doctor if these symptoms become a problem. There have been reports of increased sexual urges or other strong urges such as gambling while taking some medicines for Parkinson's disease. If you experience any of these urges while taking this medicine, you should report it to your health care provider as soon as possible. You should check your skin often for changes to moles and new growths while taking this medicine. Call your doctor if you notice any of these changes. What side effects may I notice from receiving this medicine? Side effects that you should report to your doctor or health care professional as soon as possible: -allergic reactions like skin rash, itching or hives, swelling of the face, lips, or tongue -anxiety -breathing problems -changes in vision -color changes on the skin -confusion -depressed mood -eye pain -falling asleep during normal activities like driving -hallucination, loss of contact with reality -new or increased gambling urges, sexual urges, uncontrolled spending, binge or compulsive eating, or other urges -seizures -signs and symptoms of low blood pressure like dizziness; feeling faint or lightheaded, falls; unusually weak or tired -swelling in your legs and feet -suicidal thoughts or other mood changes -trouble passing urine or change in the amount of urine -trouble sleeping -uncontrolled movements of the mouth, head, hands, feet, shoulders, eyelids or other unusual muscle movements Side effects that usually do not require medical attention (report these to your doctor or health care professional if they continue or are bothersome): -constipation -dizziness -drowsiness -dry mouth -headache -nausea This list may not describe all possible side effects. Call your doctor for medical advice about side effects. You may report side effects to FDA at 1-800-FDA-1088. Where should I keep my medicine? Keep out of the reach of  children. Store at room temperature between 20 and 25 degrees C (68 and 77 degrees F). Keep container tightly closed. Throw away any unused medicine after the expiration date. NOTE: This sheet is a summary. It may not cover all possible information. If you have questions about this medicine, talk to your doctor, pharmacist, or health care provider.  2018 Elsevier/Gold Standard (2015-12-23 12:06:00)

## 2017-03-27 NOTE — Progress Notes (Signed)
Patient: Raymond Haynes MRN: [id number] Sex: male DOB: November 07, 1995  Provider: Carylon Perches, MD Location of Care: [facility]  Note type: Routine return visit  History of Present Illness: Referral Source: Dr. Jim Like History from: mother and father, patient and referring office Chief Complaint: Autism  Raymond Haynes is a 21 y.o. male with history of autism, anxiety, HLD who presents for evaluation of the same. Review of prior history shows he last saw his PCP 01/23/2017 with HLD, fatty liver.  Labs reviewed and showing elevated AST, ALT, triglycerides.  TSH normal, A1C normal.  Ferritin normal. Mother reported interest in testing for how he processes medication and find medications that work best for him, stating she is feeling overwhelmed.    Changed respite from [facility] to [facility]care, but haven't followed up.  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  Met with Maggie at [facility], started a path plan.  Decided to start being healthy.     She reports desire for someone to help manage his medications and comfortable with patients with spceial needs.  Previously saw Dr Delton See until he aged out, now seeing Dr Ardeth Perfect who is very good but having difficulty with managing medications between him and his psychiatrist, Dr Clovis Pu. Now that he is over 19 and out of school, mother feels she doesn't know what to do for [name].    Evaluaton/Therapies: Mother first concerned at 68 months, he was late to talk and had a lot of stemming behavior.  He was diagnosed at 21yo by [facility].  Started at [employer]. He had an evaluation, diagnosed PDD-NOS.   He received speech and OT, stayed in self contained through all of school.  At 21yo, had a lot of aggressive behavior and self-injurious behavior.  Now, still bites himself.  Threatens both parents, occasionally hits mother.  Hits, kicks, bites.Usually remorseful afterwards.  Have been working on self-regulation with Dr  Florene Glen, psychologist. He has never done private therapy for OT.  No ABA therapy, [employer], [facility].    He has seen Dr America Brown, did Fragile X and Karyotype which were normal.    Cognitive: mild MR, delayed in math, reading, comprehension.    Medication:  On lexapro, paliperidone for behavior and mood. Previously on  Prozac, clonidine, risperdal.  Now taking lipitor for increase liver enzymes and cholesterol.  Never tried abilify, seroquel, etc.     Behavior: Continues to have some aggressive behavior, hits and pulls hair but does not injure.  Mother now ignores. Limited diet, severe sensory issues. Seeing Dr Florene Glen for counseling. Does not sound like they've ever done any parenting therapy. Limited by insurance.     Services:   On innovations waiver waitlist for 9 years.   He has B[facility] respite, in process of changing facilities.  7 hours a week available, but arent' consistently using it.   Just graduated in May.  Did [employer], arts the last year, now volunteering there.  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  Going to [employer] and doing pottery class.    Autism Society- started going to autism supper club.    Has a girlfriend on the spectrum.  Parents are in communication.    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    Sleep:  Always a good sleeper.  Falls asleep easily, stays asleep throughout the night, wakes himself up.   Legal: Parents have guardianship.  Have a special needs trust and paternal twin  brother and wife have physical custody and friend with financial custody.     Review of Systems: 12 system review was remarkable for anxiety, change in appetite, OCD  Past Medical History Past Medical History:  Diagnosis Date  . Anxiety   . Autism    difficulties with food textures, per mother  . Family history of adverse reaction to anesthesia    states father woke up during wisdom tooth extraction  . Fatty liver   . High cholesterol   . Impacted third molar tooth  12/2015  . Pale complexion    mother states is fair-skin and strawberry blond hair  . Pericoronitis 12/2015   third molars    Birth and Developmental History Pregnancy was uncomplicated Delivery was complicated by hypertension.  Required c-section.   Nursery Course was uncomplicated Early Growth and Development was recalled as  normal Milestones: Early milestones until 18 months.    Surgical History Past Surgical History:  Procedure Laterality Date  . NO PAST SURGERIES    . TOOTH EXTRACTION N/A 01/04/2016   Procedure: SURGICAL REMOVAL OF IMPACTED THIRD MOLARS;  Surgeon: Jannette Fogo, DDS;  Location: [facility];  Service: Oral Surgery;  Laterality: N/A;    Family History family history includes ADD / ADHD in his paternal uncle; Anesthesia problems in his father; Anxiety disorder in his mother; Autism spectrum disorder in his paternal uncle; Depression in his mother.  Paternal grandfather on spectrum per mother.  Brother with high functioning autism spectrum disorder. Hypothyroidism.    Mother seeing counselor.  Now off medication.  Exercising.   Dad is doing well.    Social History Social History   Social History Narrative   Raymond Haynes graduated from [employer]; he volunteers at [employer] and [employer]. He is waiting on a job at [employer], currently he is volunterring. He lives with his parents and sibling. He enjoys going out to restaurants, watching movies, and listening to music.     Allergies Allergies  Allergen Reactions  . Penicillins Rash    Medications Current Outpatient Medications on File Prior to Visit  Medication Sig Dispense Refill  . atorvastatin (LIPITOR) 20 MG tablet Take 20 mg by mouth daily.    . cholecalciferol (VITAMIN D) 1000 units tablet Take 1,000 Units by mouth daily.    Marland Kitchen escitalopram (LEXAPRO) 20 MG tablet Take 20 mg by mouth daily.    . metFORMIN (GLUCOPHAGE) 500 MG tablet Take 500 mg by mouth 2 (two) times daily with a  meal.     . Multiple Vitamin (MULTIVITAMIN) tablet Take 1 tablet by mouth daily.    . Omega-3 Fatty Acids (FISH OIL PO) Take by mouth.    . paliperidone (INVEGA) 3 MG 24 hr tablet Take 3 mg by mouth daily.    . vitamin B-12 (CYANOCOBALAMIN) 100 MCG tablet Take 100 mcg by mouth daily.     No current facility-administered medications on file prior to visit.    The medication list was reviewed and reconciled. All changes or newly prescribed medications were explained.  A complete medication list was provided to the patient/caregiver.  Physical Exam BP 118/70   Pulse 84   Ht 5' 10.5" (1.791 m)   Wt 215 lb 12.8 oz (97.9 kg)   BMI 30.53 kg/m   Gen: well appearing adult, light haired and fair skinned Skin: No rash, No neurocutaneous stigmata. HEENT: Normocephalic, no dysmorphic features, no conjunctival injection, nares patent, mucous membranes moist, oropharynx clear. Neck: Supple, no meningismus.  No focal tenderness. Resp: Clear to auscultation bilaterally CV: Regular rate, normal S1/S2, no murmurs, no rubs Abd: BS present, abdomen soft, non-tender, non-distended. No hepatosplenomegaly or mass Ext: Warm and well-perfused. No deformities, no muscle wasting, ROM full.  Neurological Examination: MS: Awake, alert,engaged with myself and mother.  Autistic behaviors include stemming, concrete thinking, inattentiveness to context. Follows commands well.  Cranial Nerves: Pupils were equal and reactive to light; visual field full with looking for objectst; EOM normal, no nystagmus; no ptsosis, intact facial sensation, face symmetric with full strength of facial muscles, hearing intact to finger rub bilaterally, palate elevation is symmetric, tongue protrusion is symmetric with full movement to both sides.  Sternocleidomastoid and trapezius are with normal strength. Motor-Normal tone throughout, Normal strength in all muscle groups. No abnormal movements Reflexes- Reflexes 2+ and symmetric in the  biceps, triceps, patellar and achilles tendon. Plantar responses flexor bilaterally, no clonus noted Sensation: Intact to light touch throughout.  Romberg negative. Coordination: No dysmetria on FTN test. No difficulty with balance when standing on one foot bilaterally.   Gait: Normal gait. Unable to complete tandem gait. Was able to perform toe walking and heel walking without difficulty.  Assessment and Plan Raymond Haynes is a 21 y.o. male with history of autism who presents with concerns for medications related to behavior and mood, as well as confusion and difficulty with community resources.  I reassured mother that he is largely hooked in with services, she has respite, legal and financial pieces in place, and he is finding employment.  The innovations waiver will be helpful for him and it will hopefully be available for him soon.  He should qualify for [facility] under behavioral health for further help in case management, so will refer there. Regarding medications, he has had aggression that sounds like has been difficult to treat and he has unfortunately had presumed side effects to medication including obesity, hyperlipidemia and transaminitis.   Discussed genetic testing for pharmacogenetics to better direct his medication regimen, however did counsel that it helps to determine potential poor matches but does not tell us specifically what medications to try.  Mother would like to proceed with this testing, which we can complete today through oral swab.  He is currently on paliperidone, having not taken other more common second generation antipsychotics, I expect because of the concern for HDL.  I doubt his prozac is contributing to his metabolic syndrome, but will also see how he metabolizes this on the genetic panel.    He and mother have had limited access to therapy treatments that are now more common (occupational therapy, feeding therapy, applied behavioral analysis, parent training).  I am  unsure on what he may qualify for now that he is over 21.  At next appointment, we can discuss this further and potentially try these therapies that more directly address primary symptoms of autism.    Continue current medications  Lineagen pharmacogenetic testing completed today and sent.  Expected turn around 6-8 weeks.   Referred to [facility] for diagnosis of autism and agitation  Orders Placed This Encounter  Procedures  . Ambulatory referral to Occupational Therapy    Referral Priority:   Routine    Referral Type:   Occupational Therapy    Referral Reason:   Specialty Services Required    Requested Specialty:   Occupational Therapy    Number of Visits Requested:   1    Return in about 3 months (around 06/25/2017).  Carylon Perches MD MPH  Neurology and [facility]  [place], [place], [place] [address] Phone: [phone]

## 2017-04-11 ENCOUNTER — Encounter (INDEPENDENT_AMBULATORY_CARE_PROVIDER_SITE_OTHER): Payer: Self-pay | Admitting: Pediatrics

## 2017-04-11 ENCOUNTER — Telehealth (INDEPENDENT_AMBULATORY_CARE_PROVIDER_SITE_OTHER): Payer: Self-pay | Admitting: Pediatrics

## 2017-04-11 NOTE — Telephone Encounter (Signed)
Results mailed and put to scan.

## 2017-04-11 NOTE — Telephone Encounter (Signed)
I called mother to inform her of the results of the pharmacogenetic testing.  In particular, he has moderate risk for weight gain and hyperprolactinemia with antipsychotics, mothers main concern.  Also has MTHFR gene mutation, and a mutation making him more at risk for statin-induced myopathy.  I will have Faby send a copy to parents house, and also submit my copy to media.    Mother asks that I contact Dr Clovis Pu to discuss Thad's medication management given these results and I agreed.    Carylon Perches MD MPH

## 2017-04-26 ENCOUNTER — Encounter: Payer: Self-pay | Admitting: Occupational Therapy

## 2017-04-26 ENCOUNTER — Ambulatory Visit: Payer: 59 | Attending: Pediatrics | Admitting: Occupational Therapy

## 2017-04-26 DIAGNOSIS — R4689 Other symptoms and signs involving appearance and behavior: Secondary | ICD-10-CM | POA: Diagnosis present

## 2017-04-26 DIAGNOSIS — M6281 Muscle weakness (generalized): Secondary | ICD-10-CM | POA: Insufficient documentation

## 2017-04-26 NOTE — Therapy (Signed)
Raymond Haynes [address] [place], [place], [address] Phone: [phone]   Fax:  [phone]  Occupational Therapy Evaluation  Patient Details  Name: Raymond Haynes MRN: [id number] Date of Birth: 13-Aug-1995 No Data Recorded  Encounter Date: 04/26/2017  OT End of Session - 04/26/17 1752    Visit Number  1    Number of Visits  1    Authorization Type  Medicaid    OT Start Time  1450    OT Stop Time  1532    OT Time Calculation (min)  42 min    Behavior During Therapy  [facility] for tasks assessed/performed       Past Medical History:  Diagnosis Date  . Anxiety   . Autism    difficulties with food textures, per mother  . Family history of adverse reaction to anesthesia    states father woke up during wisdom tooth extraction  . Fatty liver   . High cholesterol   . Impacted third molar tooth 12/2015  . Pale complexion    mother states is fair-skin and strawberry blond hair  . 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 NO PAST SURGERIES    . TOOTH EXTRACTION N/A 01/04/2016   Procedure: SURGICAL REMOVAL OF IMPACTED THIRD MOLARS;  Surgeon: Jannette Fogo, DDS;  Location: [facility];  Service: Oral Surgery;  Laterality: N/A;    There were no vitals filed for this visit.  Subjective Assessment - 04/26/17 1727    Currently in Pain?  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     Leisure  like to read magazines, watch movies, loves music      ADL   Eating/Feeding  Independent    Grooming  Independent    Chiropodist - Astronomer -  Control and instrumentation engineer  Independent    ADL comments  Patient able to manage basic self care skills with only occasional prompts for thoroughness      IADL   Meal Prep  Able to complete simple warm meal prep does not use stove    Medication Management  Is not capable of dispensing or managing own medication    Financial Management  Dependent      Written Expression   Dominant Hand  Right      Vision - History  Baseline Vision  No visual deficits      Vision Assessment   Eye Alignment  Within Functional Limits    Ocular Range of Motion  Within Functional Limits      Activity Tolerance   Activity Tolerance Comments  Patient has had recent weight gain, and mom concerned for metabolic syndrome.  Patient with limited interest in physical exercise as activity      Cognition   Overall Cognitive Status  History of cognitive impairments - at baseline    Behaviors  -- rocking - no aggressive behavior - patient pleasant/engaged      Posture/Postural Control   Posture/Postural Control  No significant limitations      Sensation   Additional Comments  Mom reports sensory issues related to food - fruits and vegetables      Coordination   Other  Excessive movement - but fairly well controlled      ROM / Strength   AROM / PROM / Strength  AROM      AROM   Overall AROM   Within functional limits for tasks performed                      OT Education - 04/26/17 1749    Education provided   Yes    Education Details  Discussed with patient and mom some potential goals to consider if living independently is the end goal:  Daily exercise, medication/ money management.  Mom very aware of vocational rehab, and the Autism Society of [place].  Discussed results of OT eval and no plan to initiate further OT services at this time.      Person(s) Educated  Patient;Parent(s)    Methods  Explanation    Comprehension  Verbalized understanding;Need further instruction                 Plan - 04/26/17 1752    Clinical Impression Statement  Patient is a 22 year old young man with autism, with recent aggressive behavior difficult to manage in home setting.  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  Patient also with compulsive behavior, and mom indicates that patient functions optimally with fairly strict structure and routine.  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  Patient's mom indicates that she wants to help him prepare for more independent living.  Patient is currently able to complete all absic self care skills without assistance.  Patient is connected to the [employer]ism [employer] of [place], and has been through vocational rehabilitation services.  Patient's behavior is better managed at home per mom, due to some recent medication changes.  Patient has no obvious need for OT services at this time, but explained to mom in aptient's presence that he may benefit when closer to time for independnet living.      Plan  No further OT warranted    Consulted and Agree with Plan of Care  Family member/caregiver;Patient    Family Member Consulted  mom       Patient will benefit from skilled therapeutic intervention in order to improve the following deficits and impairments:     Visit Diagnosis: Muscle weakness (generalized) - Plan: Ot plan of  care cert/re-cert  Aggression - Plan: Ot plan of care cert/re-cert    Problem List Patient Active Problem List   Diagnosis Date Noted  .  Autism 01/23/2017  . Aggression 01/23/2017  . 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 [place], [place], [address] Phone: [phone]   Fax:  [phone]  Name: Raymond Haynes MRN: [id number] Date of Birth: [birth year]

## 2017-04-29 ENCOUNTER — Encounter (INDEPENDENT_AMBULATORY_CARE_PROVIDER_SITE_OTHER): Payer: Self-pay | Admitting: Pediatrics

## 2017-05-09 ENCOUNTER — Telehealth (INDEPENDENT_AMBULATORY_CARE_PROVIDER_SITE_OTHER): Payer: Self-pay | Admitting: Pediatrics

## 2017-05-09 NOTE — Telephone Encounter (Signed)
°  Who's calling (name and relationship to patient) : (Mom) Best contact number: [phone] Provider they see: Dr. Rogers Blocker Reason for call: Mom wanted to know if Dr. Rogers Blocker reached out to pt's psychiatrist regarding medication.

## 2017-05-10 NOTE — Telephone Encounter (Signed)
I called over the holidays but was not able to reach him.  I will call again today.   Carylon Perches MD MPH

## 2017-05-10 NOTE — Telephone Encounter (Signed)
I called patient's mother and she appreciated you trying to call again. She said she will also be calling them this afternoon to have them call you. She states that the goal was to change the patient's medication and she is having to refill it over and over again. This makes her question what the standstill is on getting the process done. I reassured that you would still attempt to contact Psychiatrist and would call right back if they called [place] first.

## 2017-05-16 DIAGNOSIS — D2262 Melanocytic nevi of left upper limb, including shoulder: Secondary | ICD-10-CM | POA: Diagnosis not present

## 2017-05-16 DIAGNOSIS — D2261 Melanocytic nevi of right upper limb, including shoulder: Secondary | ICD-10-CM | POA: Diagnosis not present

## 2017-05-16 DIAGNOSIS — D225 Melanocytic nevi of trunk: Secondary | ICD-10-CM | POA: Diagnosis not present

## 2017-05-22 ENCOUNTER — Telehealth (INDEPENDENT_AMBULATORY_CARE_PROVIDER_SITE_OTHER): Payer: Self-pay | Admitting: Pediatrics

## 2017-05-22 NOTE — Telephone Encounter (Signed)
°  Who's calling (name and relationship to patient) :  Best contact number:  Provider they see:  Reason for call: Mom called left message about medication changes and following up a a discussion with a physiatrist.  Please call.    PRESCRIPTION REFILL ONLY  Name of prescription:  Pharmacy:

## 2017-05-22 NOTE — Telephone Encounter (Signed)
Patient's mother would like to talk to Dr. Rogers Blocker in regards to the discussion she was supposed to have with Denzel's psychiatrist. This conversation was to lead to medication changes and the medication changes have not taken place for months now. Mother would like to talk to Dr. Rogers Blocker about this further.

## 2017-05-24 ENCOUNTER — Encounter (INDEPENDENT_AMBULATORY_CARE_PROVIDER_SITE_OTHER): Payer: Self-pay | Admitting: *Deleted

## 2017-05-27 NOTE — Telephone Encounter (Signed)
Discussed with patient's mother on 05/24/17 that I have attempted to call Dr Clovis Pu three times with no response.  I did receive his paperwork On Friday and will review it.  I made clear that although I am willing to manage his medications from a neurodevelopmental perspective, I am not familiar will paliperidone, and do not prescribe medications related to the metabolic syndrome that they can create.  I do think it is best to keep a psychiatrist.    Once I review the paperwork, I will contact Dr Clovis Pu again to discuss medication management.  If I can not contact him, I am willing to try amantadine with the other medications in the meantime, can work with his general physician.  Mother voiced understanding.     Carylon Perches MD MPH

## 2017-05-31 ENCOUNTER — Telehealth (INDEPENDENT_AMBULATORY_CARE_PROVIDER_SITE_OTHER): Payer: Self-pay | Admitting: Pediatrics

## 2017-05-31 NOTE — Telephone Encounter (Signed)
°  Who's calling (name and relationship to patient) : Tonia Ghent (Mother) Best contact number: [phone] Provider they see: Dr. Rogers Blocker Reason for call: Mom called and would like to speak with Dr. Rogers Blocker regarding pt's medications before his appointment with the psychiatrist Monday morning. If possible, she would like to speak with Dr. Rogers Blocker Monday morning before at or before 9am. She wants to make sure she has as much information as possible to give to the psychiatrist before the appointment.

## 2017-05-31 NOTE — Telephone Encounter (Signed)
Veryl Speak, Matvey's mother, and let her know that Dr. Rogers Blocker was out of the office this afternoon. I let her know that I would relay her message and let her know to call her either today or Monday morning. Mother verbalized understanding and agreement.

## 2017-06-03 ENCOUNTER — Telehealth (INDEPENDENT_AMBULATORY_CARE_PROVIDER_SITE_OTHER): Payer: Self-pay | Admitting: Pediatrics

## 2017-06-03 NOTE — Telephone Encounter (Signed)
Called mother regarding this morning's appointment and left message advising her to bring pharmacogenetics paperwork with her to appointment, and discuss amantadine. Please have psychiatrist call me to discuss further, and mother can call if she has any questions after her psychiatry appointment.   Carylon Perches MD MPH

## 2017-06-03 NOTE — Telephone Encounter (Signed)
Dr Clovis Pu called me directly to discuss medication management for [facility].  In particular, discussed amantadine as a new adjunctive for children with autism, low side effect profile.  He informed me Lorayne Bender was a risperdal derivative, sometimes causes less weight gain.  I also discussed trying Abilify, other antipsychotic that causes less wight gain.  Also thinking of alpha agonists or stimulants as an adjunct to help with impulse control and improve weight gain. He was considering antiepileptics (depakote, carbamezapine, trileptal), which I informed him I do not usually use for behavior so I am unable to give advice, but would be concerned with side effects of Depakote.  He informed me he thinks Soma has been on Intuniv before, not clonidine, but will double check.  He was also concerned for tic symptoms.    Dr Clovis Pu will try cross titrating amantadine and decreasing Invega.  Agreed on Abilify as another option, and  will look into stimulants.    Carylon Perches MD MPH

## 2017-06-26 ENCOUNTER — Ambulatory Visit (INDEPENDENT_AMBULATORY_CARE_PROVIDER_SITE_OTHER): Payer: 59 | Admitting: Pediatrics

## 2017-08-13 ENCOUNTER — Encounter (INDEPENDENT_AMBULATORY_CARE_PROVIDER_SITE_OTHER): Payer: Self-pay | Admitting: Pediatrics

## 2017-08-13 ENCOUNTER — Ambulatory Visit (INDEPENDENT_AMBULATORY_CARE_PROVIDER_SITE_OTHER): Payer: 59 | Admitting: Pediatrics

## 2017-08-13 VITALS — BP 122/84 | HR 76 | Ht 70.0 in | Wt 197.0 lb

## 2017-08-13 DIAGNOSIS — I10 Essential (primary) hypertension: Secondary | ICD-10-CM | POA: Insufficient documentation

## 2017-08-13 DIAGNOSIS — E661 Drug-induced obesity: Secondary | ICD-10-CM | POA: Diagnosis not present

## 2017-08-13 DIAGNOSIS — R4689 Other symptoms and signs involving appearance and behavior: Secondary | ICD-10-CM

## 2017-08-13 DIAGNOSIS — R945 Abnormal results of liver function studies: Secondary | ICD-10-CM | POA: Diagnosis not present

## 2017-08-13 DIAGNOSIS — F840 Autistic disorder: Secondary | ICD-10-CM

## 2017-08-13 DIAGNOSIS — R7989 Other specified abnormal findings of blood chemistry: Secondary | ICD-10-CM | POA: Insufficient documentation

## 2017-08-13 NOTE — Patient Instructions (Addendum)
Zones of Regulation- consider an outside source of rules (behavior board, color chart with recommendations based on color) Stay on amantadine until other meds are decreased Recommend LFTs and A1C at next PCP appointment

## 2017-08-13 NOTE — Progress Notes (Signed)
Patient: Raymond Haynes MRN: [id number] Sex: male DOB: February 15, 1996  Provider: Carylon Perches, MD Location of Care: [facility]  Note type: Routine return visit  History of Present Illness: Referral Source: Dr. Jim Like History from: mother and father, patient and referring office Chief Complaint: Autism  Raymond Haynes is a 22 y.o. male with history of autism, anxiety, HLD who presents for routine follow-up.  I have been helping mother with transitioning medications to limit metabolic side effects.    Since last appointment, she saw Dr Peyton Najjar.  I discussed with him.  He has now started with Jobie Quaker PA who knew about amantadine but also didn't want to make changes.  Seen 2 weeks ago.  They did a break off amantadine for 48 hours, didn't see any change off medications.   Since starting amantadine and weaning antipsychotic, mother feels Daisean is "about the same".  Haven't had any issues with aggression, however still rigid, still having trouble getting overstimulated. The conflict around eating is now better.   At next appointment, plan to start a mindfulness curriculum.     Patient history: Evaluaton/Therapies: Mother first concerned at 56 months, he was late to talk and had a lot of stemming behavior.  He was diagnosed at 22yo by [facility].  Started at [employer]. He had an evaluation, diagnosed PDD-NOS.   He received speech and OT, stayed in self contained through all of school.  At 22yo, had a lot of aggressive behavior and self-injurious behavior.  Now, still bites himself.  Threatens both parents, occasionally hits mother.  Hits, kicks, bites.Usually remorseful afterwards.  Have been working on self-regulation with Dr Florene Glen, psychologist. He has never done private therapy for OT.  No ABA therapy, [facility], PEC.    He has seen Dr America Brown, did Fragile X and Karyotype which were normal.    Cognitive: mild MR, delayed in math, reading, comprehension.     Medication:  On lexapro, paliperidone for behavior and mood. Previously on  Prozac, clonidine, risperdal.  Now taking lipitor for increase liver enzymes and cholesterol.  Never tried abilify, seroquel, etc.     Behavior: Continues to have some aggressive behavior, hits and pulls hair but does not injure.  Mother now ignores. Limited diet, severe sensory issues. Seeing Dr Florene Glen for counseling. Does not sound like they've ever done any parenting therapy. Limited by insurance.     Services:   On innovations waiver waitlist for 9 years.   He has [facility] respite, in process of changing facilities.  7 hours a week available, but arent' consistently using it.   Just graduated in May.  Did [employer], arts the last year, now volunteering there.  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  Going to [employer] and doing pottery class.    Autism Society- started going to autism supper club.    Has a girlfriend on the spectrum.  Parents are in communication.    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    Sleep:  Always a good sleeper.  Falls asleep easily, stays asleep throughout the night, wakes himself up.   Legal: Parents have guardianship.  Have a special needs trust and paternal twin brother and wife have physical custody and friend with financial custody.     Past Medical History Past Medical History:  Diagnosis Date  . Anxiety   . Autism    difficulties with food textures, per mother  . Family history of adverse reaction  to anesthesia    states father woke up during wisdom tooth extraction  . Fatty liver   . High cholesterol   . Impacted third molar tooth 12/2015  . Pale complexion    mother states is fair-skin and strawberry blond hair  . Pericoronitis 12/2015   third molars    Birth and Developmental History Pregnancy was uncomplicated Delivery was complicated by hypertension.  Required c-section.   Nursery Course was uncomplicated Early Growth and Development was recalled  as  normal Milestones: Early milestones until 18 months.    Surgical History Past Surgical History:  Procedure Laterality Date  . NO PAST SURGERIES    . TOOTH EXTRACTION N/A 01/04/2016   Procedure: SURGICAL REMOVAL OF IMPACTED THIRD MOLARS;  Surgeon: Jannette Fogo, DDS;  Location: [facility];  Service: Oral Surgery;  Laterality: N/A;    Family History family history includes ADD / ADHD in his paternal uncle; Anesthesia problems in his father; Anxiety disorder in his mother; Autism spectrum disorder in his paternal uncle; Depression in his mother.  Paternal grandfather on spectrum per mother.  Brother with high functioning autism spectrum disorder. Hypothyroidism.    Mother seeing counselor.  Now off medication.  Exercising.   Dad is doing well.    Social History Social History   Social History Narrative   Aadith graduated from [employer]; he volunteers at [employer] and [employer]. He is waiting on a job at [employer], currently he is volunterring. He lives with his parents and sibling. He enjoys going out to restaurants, watching movies, and listening to music.       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     Allergies Allergies  Allergen Reactions  . Penicillins Rash    Medications Current Outpatient Medications on File Prior to Visit  Medication Sig Dispense Refill  . amantadine (SYMMETREL) 100 MG capsule Take 100 mg by mouth 2 (two) times daily.    Marland Kitchen atorvastatin (LIPITOR) 20 MG tablet Take 20 mg by mouth daily.    . cholecalciferol (VITAMIN D) 1000 units tablet Take 1,000 Units by mouth daily.    Marland Kitchen escitalopram (LEXAPRO) 20 MG tablet Take 20 mg by mouth daily.    . metFORMIN (GLUCOPHAGE) 500 MG tablet Take 500 mg by mouth 2 (two) times daily with a meal.     . Multiple Vitamin (MULTIVITAMIN) tablet Take 1 tablet by mouth daily.    . Omega-3 Fatty Acids (FISH OIL PO) Take by mouth.    . vitamin B-12 (CYANOCOBALAMIN) 100 MCG tablet Take 100 mcg  by mouth daily.     No current facility-administered medications on file prior to visit.    The medication list was reviewed and reconciled. All changes or newly prescribed medications were explained.  A complete medication list was provided to the patient/caregiver.  Physical Exam BP 122/84   Pulse 76   Ht 5\' 10"  (1.778 m)   Wt 197 lb (89.4 kg)   BMI 28.27 kg/m   Gen: well appearing adult, light haired and fair skinned Skin: No rash, No neurocutaneous stigmata. HEENT: Normocephalic, no dysmorphic features, no conjunctival injection, nares patent, mucous membranes moist, oropharynx clear. Neck: Supple, no meningismus. No focal tenderness. Resp: Clear to auscultation bilaterally CV: Regular rate, normal S1/S2, no murmurs, no rubs Abd: BS present, abdomen soft, non-tender, non-distended. No hepatosplenomegaly or mass Ext: Warm and well-perfused. No deformities, no muscle wasting, ROM full.  Neurological Examination: MS: Awake, alert,engaged with myself and mother.  Autistic behaviors  include stemming, concrete thinking, inattentiveness to context. Follows commands well.  Cranial Nerves: Pupils were equal and reactive to light; visual field full with looking for objectst; EOM normal, no nystagmus; no ptsosis, intact facial sensation, face symmetric with full strength of facial muscles, hearing intact to finger rub bilaterally, palate elevation is symmetric, tongue protrusion is symmetric with full movement to both sides.  Sternocleidomastoid and trapezius are with normal strength. Motor-Normal tone throughout, Normal strength in all muscle groups. No abnormal movements Reflexes- Reflexes 2+ and symmetric in the biceps, triceps, patellar and achilles tendon. Plantar responses flexor bilaterally, no clonus noted Sensation: Intact to light touch throughout.  Romberg negative. Coordination: No dysmetria on FTN test. No difficulty with balance when standing on one foot bilaterally.   Gait:  Normal gait. Unable to complete tandem gait. Was able to perform toe walking and heel walking without difficulty.  Assessment and Plan Helio Lack is a 22 y.o. male with history of autism who presents for routine follow-up.  Patient now switched off Inega to amantadine without worsening of symptoms and with improvement in weight.  I discussed with mother that I consider that a win.  Recommend talking with Dr Ardeth Perfect about when to recheck LFTs, if able to wean lipitor, metformin.  I advised it will likely take time for these things to stabilize. Mother wondering if since he did not have change in behavior with medication change, if medicaitons are even doing anything.  I advised her to stay on medication at least for right now until other medications are weaned, then consider weaning this one.  WOuld not want to have more than 1 change at a time.  Mother in agreement.  Counseled mother that even though he is now an adult, I would still recommend behavioral management as the primary treatment for his behaviors, medication may not be the key.  Discussed some strategies used in my younger patients that [place] may enjoy.     Recommend looking up "Zones of Regulation"  Consider an outside source of rules (behavior board, color chart with recommendations based on color)  Stay on amantadine until other meds are decreased  Discuss metabolic syndrome at next PCP appointment  Return in about 4 months (around 12/13/2017).  Carylon Perches MD MPH Neurology [facility]  [place], [place], [place] [address] Phone: [phone]

## 2017-09-02 ENCOUNTER — Encounter (INDEPENDENT_AMBULATORY_CARE_PROVIDER_SITE_OTHER): Payer: Self-pay | Admitting: Pediatrics

## 2017-11-19 DIAGNOSIS — Z0000 Encounter for general adult medical examination without abnormal findings: Secondary | ICD-10-CM | POA: Diagnosis not present

## 2017-11-19 DIAGNOSIS — R82998 Other abnormal findings in urine: Secondary | ICD-10-CM | POA: Diagnosis not present

## 2017-11-19 DIAGNOSIS — E7849 Other hyperlipidemia: Secondary | ICD-10-CM | POA: Diagnosis not present

## 2017-11-26 DIAGNOSIS — Z1389 Encounter for screening for other disorder: Secondary | ICD-10-CM | POA: Diagnosis not present

## 2017-11-26 DIAGNOSIS — R945 Abnormal results of liver function studies: Secondary | ICD-10-CM | POA: Diagnosis not present

## 2017-11-26 DIAGNOSIS — E781 Pure hyperglyceridemia: Secondary | ICD-10-CM | POA: Diagnosis not present

## 2017-11-26 DIAGNOSIS — Z0000 Encounter for general adult medical examination without abnormal findings: Secondary | ICD-10-CM | POA: Diagnosis not present

## 2017-11-26 DIAGNOSIS — R740 Nonspecific elevation of levels of transaminase and lactic acid dehydrogenase [LDH]: Secondary | ICD-10-CM | POA: Diagnosis not present

## 2017-11-28 ENCOUNTER — Encounter (INDEPENDENT_AMBULATORY_CARE_PROVIDER_SITE_OTHER): Payer: Self-pay | Admitting: Pediatrics

## 2018-01-27 DIAGNOSIS — R945 Abnormal results of liver function studies: Secondary | ICD-10-CM | POA: Diagnosis not present

## 2018-01-27 DIAGNOSIS — E7849 Other hyperlipidemia: Secondary | ICD-10-CM | POA: Diagnosis not present

## 2018-05-30 DIAGNOSIS — E663 Overweight: Secondary | ICD-10-CM | POA: Diagnosis not present

## 2018-05-30 DIAGNOSIS — E7849 Other hyperlipidemia: Secondary | ICD-10-CM | POA: Diagnosis not present

## 2018-05-30 DIAGNOSIS — R945 Abnormal results of liver function studies: Secondary | ICD-10-CM | POA: Diagnosis not present

## 2018-08-27 DIAGNOSIS — M25559 Pain in unspecified hip: Secondary | ICD-10-CM | POA: Diagnosis not present

## 2018-08-27 DIAGNOSIS — R635 Abnormal weight gain: Secondary | ICD-10-CM | POA: Diagnosis not present

## 2018-08-29 DIAGNOSIS — Z79899 Other long term (current) drug therapy: Secondary | ICD-10-CM | POA: Diagnosis not present

## 2018-08-29 DIAGNOSIS — R635 Abnormal weight gain: Secondary | ICD-10-CM | POA: Diagnosis not present

## 2018-09-01 DIAGNOSIS — R739 Hyperglycemia, unspecified: Secondary | ICD-10-CM | POA: Diagnosis not present

## 2018-12-05 ENCOUNTER — Other Ambulatory Visit: Payer: Self-pay | Admitting: Internal Medicine

## 2018-12-05 DIAGNOSIS — R7989 Other specified abnormal findings of blood chemistry: Secondary | ICD-10-CM

## 2018-12-10 ENCOUNTER — Other Ambulatory Visit (HOSPITAL_COMMUNITY): Payer: Self-pay | Admitting: Nurse Practitioner

## 2018-12-10 DIAGNOSIS — B179 Acute viral hepatitis, unspecified: Secondary | ICD-10-CM

## 2018-12-11 ENCOUNTER — Other Ambulatory Visit (HOSPITAL_COMMUNITY): Payer: Self-pay | Admitting: Nurse Practitioner

## 2018-12-11 DIAGNOSIS — B179 Acute viral hepatitis, unspecified: Secondary | ICD-10-CM

## 2018-12-16 ENCOUNTER — Ambulatory Visit
Admission: RE | Admit: 2018-12-16 | Discharge: 2018-12-16 | Disposition: A | Discharge status: Home / Self Care | Payer: 59 | Source: Ambulatory Visit | Attending: Internal Medicine | Admitting: Internal Medicine

## 2018-12-16 DIAGNOSIS — R7989 Other specified abnormal findings of blood chemistry: Secondary | ICD-10-CM

## 2018-12-18 ENCOUNTER — Encounter: Payer: Self-pay | Admitting: *Deleted

## 2018-12-18 ENCOUNTER — Other Ambulatory Visit: Payer: Self-pay

## 2018-12-18 ENCOUNTER — Ambulatory Visit
Admission: RE | Admit: 2018-12-18 | Discharge: 2018-12-18 | Disposition: A | Discharge status: Home / Self Care | Payer: 59 | Source: Ambulatory Visit | Attending: Nurse Practitioner | Admitting: Nurse Practitioner

## 2018-12-18 DIAGNOSIS — B179 Acute viral hepatitis, unspecified: Secondary | ICD-10-CM

## 2018-12-18 HISTORY — PX: IR RADIOLOGIST EVAL & MGMT: IMG5224

## 2018-12-18 NOTE — Consult Note (Signed)
Chief Complaint: Patient was consulted remotely today (TeleHealth) for elevated LFTs at the request of Drazek,Dawn.    Referring Physician(s): Drazek,Dawn  History of Present Illness: Raymond Haynes is a 23 y.o. male with a history of autism and more recently diagnosed bipolar disorder.  He was initially started on Abilify but due to significant weight gain and metabolic effect, was transitioned to Depakote.  While on Depakote it was noted that he developed fatty liver and severe elevation of the liver enzymes.  He has now weaning off of Depakote and has switched to [place].  Due to the significant elevation in the liver enzymes there is concern that this may represent an underlying drug-induced hepatitis versus steatohepatitis or autoimmune hepatitis.  He was referred by [facility] to consider random liver biopsy.  He would likely require sedation.  Today's teleconference took place with Raymond Haynes's mother and guardian, Tosh Danesh.  [name] is currently in his usual state of health.  It is been a relatively hard week with his underlying mental and behavioral struggles while coming off of Depakote and switching medications.  Past Medical History:  Diagnosis Date  . Anxiety   . Autism    difficulties with food textures, per mother  . Family history of adverse reaction to anesthesia    states father woke up during wisdom tooth extraction  . Fatty liver   . High cholesterol   . Impacted third molar tooth 12/2015  . Pale complexion    mother states is fair-skin and strawberry blond hair  . 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 NO PAST SURGERIES    . TOOTH EXTRACTION N/A 01/04/2016   Procedure: SURGICAL REMOVAL OF IMPACTED THIRD MOLARS;  Surgeon: Jannette Fogo, DDS;  Location: [facility];  Service: Oral Surgery;  Laterality: N/A;    Allergies: Penicillins  Medications: Prior to Admission medications   Medication  Sig Start Date End Date Taking? Authorizing Provider  amantadine (SYMMETREL) 100 MG capsule Take 100 mg by mouth 2 (two) times daily.    [provider]  atorvastatin (LIPITOR) 20 MG tablet Take 20 mg by mouth daily.    [provider]  cholecalciferol (VITAMIN D) 1000 units tablet Take 1,000 Units by mouth daily.    [provider]  escitalopram (LEXAPRO) 20 MG tablet Take 20 mg by mouth daily.    [provider]  metFORMIN (GLUCOPHAGE) 500 MG tablet Take 500 mg by mouth 2 (two) times daily with a meal.     [provider]  Multiple Vitamin (MULTIVITAMIN) tablet Take 1 tablet by mouth daily.    [provider]  Omega-3 Fatty Acids (FISH OIL PO) Take by mouth.    [provider]  vitamin B-12 (CYANOCOBALAMIN) 100 MCG tablet Take 100 mcg by mouth daily.    [provider]     Family History  Problem Relation Age of Onset  . Anesthesia problems Father        woke up during wisdom tooth extraction  . Depression Mother   . Anxiety disorder Mother   . ADD / ADHD Paternal Uncle   . Autism spectrum disorder Paternal Uncle   . Migraines Neg Hx   . Seizures Neg Hx   . Bipolar disorder Neg Hx   . Schizophrenia Neg Hx     Social History   Socioeconomic History  . Marital status: Single    Spouse name: Not  on file  . Number of children: Not on file  . Years of education: Not on file  . Highest education level: Not on file  Occupational History  . Not on file  Social Needs  . Financial resource strain: Not on file  . Food insecurity    Worry: Not on file    Inability: Not on file  . Transportation needs    Medical: Not on file    Non-medical: Not on file  Tobacco Use  . Smoking status: Never Smoker  . Smokeless tobacco: Never Used  Substance and Sexual Activity  . Alcohol use: No  . Drug use: No  . Sexual activity: Not on file  Lifestyle  . Physical activity    Days per week: Not on file    Minutes per  session: Not on file  . Stress: Not on file  Relationships  . Social Herbalist on phone: Not on file    Gets together: Not on file    Attends religious service: Not on file    Active member of club or organization: Not on file    Attends meetings of clubs or organizations: Not on file    Relationship status: Not on file  Other Topics Concern  . Not on file  Social History Narrative   Sehaj graduated from [employer]; he volunteers at [employer] and [employer]. He is waiting on a job at [employer], currently he is volunterring. He lives with his parents and sibling. He enjoys going out to restaurants, watching movies, and listening to music.       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     Review of Systems  Review of Systems: A 12 point ROS discussed and pertinent positives are indicated in the HPI above.  All other systems are negative.  Physical Exam No direct physical exam was performed (except for noted visual exam findings with Video Visits).   Vital Signs: There were no vitals taken for this visit.  Imaging: US Abdomen Limited Ruq  Result Date: 12/16/2018 CLINICAL DATA:  Elevated liver enzymes EXAM: ULTRASOUND ABDOMEN LIMITED RIGHT UPPER QUADRANT COMPARISON:  None. FINDINGS: Gallbladder: No gallstones or wall thickening visualized. There is no pericholecystic fluid. No sonographic Murphy sign noted by sonographer. Common bile duct: Diameter: 7 mm, upper normal. No intrahepatic or extrahepatic biliary duct dilatation evident. Note that portions of the common bile duct are obscured by gas. Liver: No focal lesion identified. Liver echogenicity is increased diffusely. Portal vein is patent on color Doppler imaging with normal direction of blood flow towards the liver. Other: None. IMPRESSION: 1. Increase in liver echogenicity diffusely, a finding indicative of hepatic steatosis. No focal liver lesions evident. 2. Portions of the common bile duct obscured by  gas. Visualized portions of the common bile duct are at the upper limits of normal. No intrahepatic biliary duct dilatation evident. 3.  No demonstrable gallbladder pathology. Electronically Signed   By: Lowella Grip III M.D.   On: 12/16/2018 11:09    Labs:  CBC: No results for input(s): WBC, HGB, HCT, PLT in the last 8760 hours.  COAGS: No results for input(s): INR, APTT in the last 8760 hours.  BMP: No results for input(s): NA, K, CL, CO2, GLUCOSE, BUN, CALCIUM, CREATININE, GFRNONAA, GFRAA in the last 8760 hours.  Invalid input(s): CMP  LIVER FUNCTION TESTS: No results for input(s): BILITOT, AST, ALT, ALKPHOS, PROT, ALBUMIN in the last 8760 hours.  TUMOR MARKERS: No results for  input(s): AFPTM, CEA, CA199, CHROMGRNA in the last 8760 hours.  Assessment and Plan:  23 year old male with autism and bipolar disorder as well as significant hepatic steatosis and elevation of the liver enzymes concerning for intrinsic liver disease, drug reaction or autoimmune hepatitis.  There is a fairly high concern that this represents liver damage secondary to longstanding antipsychotic use.  Ultrasound-guided random liver biopsy was discussed at length with Maxie's mother, Rand Forseth.  Time was given to answer any questions.  Mrs. Polek understands and desires to proceed with the biopsy.  1.)  Please schedule for ultrasound-guided random liver biopsy to be performed at either [facility] or [facility].  Jaaziah's mother will need to accompany him.  Thank you for this interesting consult.  I greatly enjoyed meeting Agamveer Dannemiller and look forward to participating in their care.  A copy of this report was sent to the requesting provider on this date.  Electronically Signed: Jacqulynn Cadet 12/18/2018, 3:28 PM   I spent a total of 15 Minutes in remote  clinical consultation, greater than 50% of which was counseling/coordinating care for elevated LFTs, steatosis.    Visit type: Audio only  (telephone). Audio (no video) only due to patient preference. Alternative for in-person consultation at [facility], [address], [place], [place]. This visit type was conducted due to national recommendations for restrictions regarding the COVID-19 Pandemic (e.g. social distancing).  This format is felt to be most appropriate for this patient at this time.  All issues noted in this document were discussed and addressed.

## 2018-12-23 ENCOUNTER — Other Ambulatory Visit: Payer: Self-pay | Admitting: Radiology

## 2018-12-24 ENCOUNTER — Ambulatory Visit (HOSPITAL_COMMUNITY)
Admission: RE | Admit: 2018-12-24 | Discharge: 2018-12-24 | Disposition: A | Discharge status: Home / Self Care | Payer: 59 | Source: Ambulatory Visit | Attending: Nurse Practitioner | Admitting: Nurse Practitioner

## 2018-12-24 ENCOUNTER — Encounter (HOSPITAL_COMMUNITY): Payer: Self-pay

## 2018-12-24 ENCOUNTER — Other Ambulatory Visit: Payer: Self-pay

## 2018-12-24 DIAGNOSIS — E7800 Pure hypercholesterolemia, unspecified: Secondary | ICD-10-CM | POA: Diagnosis not present

## 2018-12-24 DIAGNOSIS — F840 Autistic disorder: Secondary | ICD-10-CM | POA: Diagnosis not present

## 2018-12-24 DIAGNOSIS — B179 Acute viral hepatitis, unspecified: Secondary | ICD-10-CM | POA: Insufficient documentation

## 2018-12-24 DIAGNOSIS — Z79899 Other long term (current) drug therapy: Secondary | ICD-10-CM | POA: Diagnosis not present

## 2018-12-24 DIAGNOSIS — K760 Fatty (change of) liver, not elsewhere classified: Secondary | ICD-10-CM | POA: Diagnosis not present

## 2018-12-24 DIAGNOSIS — E785 Hyperlipidemia, unspecified: Secondary | ICD-10-CM | POA: Diagnosis not present

## 2018-12-24 LAB — CBC
HCT: 43.9 % (ref 39.0–52.0)
Hemoglobin: 14.4 g/dL (ref 13.0–17.0)
MCH: 29 pg (ref 26.0–34.0)
MCHC: 32.8 g/dL (ref 30.0–36.0)
MCV: 88.5 fL (ref 80.0–100.0)
Platelets: 269 10*3/uL (ref 150–400)
RBC: 4.96 MIL/uL (ref 4.22–5.81)
RDW: 12.9 % (ref 11.5–15.5)
WBC: 7.4 10*3/uL (ref 4.0–10.5)
nRBC: 0 % (ref 0.0–0.2)

## 2018-12-24 LAB — PROTIME-INR
INR: 1 (ref 0.8–1.2)
Prothrombin Time: 13.4 seconds (ref 11.4–15.2)

## 2018-12-24 LAB — APTT: aPTT: 32 seconds (ref 24–36)

## 2018-12-24 MED ORDER — MIDAZOLAM HCL 2 MG/2ML IJ SOLN
INTRAMUSCULAR | Status: DC | PRN
  Administered 2018-12-24: 1 mg via INTRAVENOUS

## 2018-12-24 MED ORDER — SODIUM CHLORIDE 0.9 % IV SOLN
INTRAVENOUS | Status: DC | PRN
  Administered 2018-12-24: 10 mL/h via INTRAVENOUS

## 2018-12-24 MED ORDER — SODIUM CHLORIDE 0.9 % IV SOLN: INTRAVENOUS | Status: DC

## 2018-12-24 MED ORDER — GELATIN ABSORBABLE 12-7 MM EX MISC
CUTANEOUS | Status: AC
  Filled 2018-12-24: qty 1

## 2018-12-24 MED ORDER — FENTANYL CITRATE (PF) 100 MCG/2ML IJ SOLN
INTRAMUSCULAR | Status: AC
  Filled 2018-12-24: qty 4

## 2018-12-24 MED ORDER — LIDOCAINE HCL (PF) 1 % IJ SOLN
INTRAMUSCULAR | Status: AC
  Filled 2018-12-24: qty 30

## 2018-12-24 MED ORDER — FENTANYL CITRATE (PF) 100 MCG/2ML IJ SOLN
INTRAMUSCULAR | Status: DC | PRN
  Administered 2018-12-24: 50 ug via INTRAVENOUS

## 2018-12-24 MED ORDER — MIDAZOLAM HCL 2 MG/2ML IJ SOLN
INTRAMUSCULAR | Status: AC
  Filled 2018-12-24: qty 4

## 2018-12-24 NOTE — Procedures (Signed)
Interventional Radiology Procedure:   Indications: Abnormal liver enzymes  Procedure: US guided liver biopsy  Findings: Echogenic liver, 3 cores obtained.   Complications: None     EBL: less than 10 ml  Plan: Bedrest 3 hours, then discharge to home.    Zooey Schreurs R. Anselm Pancoast, MD  Pager: [phone]

## 2018-12-24 NOTE — Discharge Instructions (Signed)
Liver Biopsy ° °The liver is a large organ in the upper right side of the abdomen. A liver biopsy is a procedure in which a tissue sample is taken from the liver and examined under a microscope. °There are three types of liver biopsies: °· Percutaneous. A needle is used to remove a sample through an incision in your abdomen. °· Laparoscopic. Several incisions are made in the abdomen. A sample is removed with the help of a tiny camera. °· Transjugular. An incision is made in your neck in the area of the jugular vein. A sample is removed through a small flexible tube that is passed down the blood vessel and into your liver. °Tell a health care provider about: °· Any allergies you have. °· All medicines you are taking, including vitamins, herbs, eye drops, creams, and over-the-counter medicines. °· Any problems you or family members have had with anesthetic medicines. °· Any blood disorders you have. °· Any surgeries you have had. °· Any medical conditions you have. °· Whether you are pregnant or may be pregnant. °What are the risks? °Generally, this is a safe procedure. However, problems can occur and include: °· Bleeding. °· Infection. °· Bruising. °· Pain. °· Injury to nearby organs or tissues, such as nerves, gallbladder, liver, or lungs. °What happens before the procedure? °Eating and drinking restrictions °· You may be asked not to drink or eat for 6-8 hours before the liver biopsy. You may be allowed to eat a light breakfast. Talk to your health care provider about when you should stop eating and drinking. °Medicines °Ask your health care provider about: °· Changing or stopping your regular medicines. This is especially important if you are taking diabetes medicines or blood thinners. °· Taking medicines such as aspirin and ibuprofen. These medicines can thin your blood. Do not take these medicines unless your health care provider tells you to take them. °· Taking over-the-counter medicines, vitamins, herbs, and  supplements. °General instructions °· Do not use any products that contain nicotine or tobacco, such as cigarettes and e-cigarettes. If you need help quitting, ask your health care provider. °· Plan to have someone take you home from the hospital or clinic. °· Plan to have a responsible adult care for you for at least 24 hours after you leave the hospital or clinic. This is important. °· You may have blood or urine tests. °· Ask your health care provider what steps will be taken to prevent infection. These may include: °? Removing hair at the surgery site. °? Washing skin with a germ-killing soap. °? Taking antibiotic medicine. °What happens during the procedure? °· An IV will be inserted into one of your veins. °? You will be given one or more of the following: °? A medicine to help you relax (sedative). °? A medicine to numb the area (local anesthetic). °? A medicine to make you fall asleep (general anesthetic). °· Your health care provider will use one of the following procedures to remove samples from your liver. These procedures may vary among health care providers and hospitals. °Percutaneous liver biopsy °· You will lie on your back, with your right hand over your head. °· A health care provider will locate your liver by tapping and pressing on the right side of your abdomen, or by using an ultrasound or CT scan. °· A local anesthetic will be used to numb an area at the bottom of your last right rib. °· A small incision will be made in the numbed area. °·   A biopsy needle will be inserted into the incision. °· Several samples of liver tissue will be taken. You will be asked to hold your breath as each sample is taken. °· The incision will be closed with stitches (sutures). °· A bandage (dressing) may be placed over the incision. °Laparoscopic liver biopsy °· You will lie on your back. °· Several small incisions will be made in your abdomen. °· Your health care provider will pass a tiny camera through one  incision. The camera will allow the liver to be viewed on a TV monitor in the operating room. °· Tools will be passed through the other incision or incisions. °· Samples of the liver will be removed using the tools. °· The incisions will be closed with stitches (sutures). °· A bandage (dressing) may be placed over the incisions. °Transjugular liver biopsy °· You will lie on your back on an X-ray table, with your head turned to your left. °· An area on your neck, just over your jugular vein, will be numbed. °· An incision will be made in the numbed area. °· A tiny tube will be inserted through the incision. The tube will be passed into the jugular vein to a blood vessel in the liver called the hepatic vein. °· A dye will be injected through the tube. °· X-rays will be taken. The dye will make the blood vessels in the liver light up on the X-rays. °· The biopsy needle will be placed through the tube until it reaches the liver. °· Samples of liver tissue will be taken with the biopsy needle. °· The needle and the tube will be removed. °· The incision will be closed with stitches (sutures). °· A bandage (dressing) may be placed over the incision. °What happens after the procedure? °· Your blood pressure, heart rate, breathing rate, and blood oxygen level will be monitored until you leave the hospital or clinic. °· You will be asked to rest quietly for 2-4 hours or longer. °· You will be closely monitored for bleeding from the biopsy site. °· You may be allowed to go home when the medicines have worn off and you can walk, drink, eat, and use the bathroom. °Summary °· A liver biopsy is a procedure in which a tissue sample is taken from the liver and examined under a microscope. °· This is a safe procedure, but problems can occur, including bleeding, infection, pain, or injury to nearby organs or tissues. °· Ask your health care provider about changing or stopping your regular medicines. °· Plan to have someone take you  home from the hospital or clinic and to be with you for 24 hours after the procedure. °This information is not intended to replace advice given to you by your health care provider. Make sure you discuss any questions you have with your health care provider. °Document Released: 06/30/2003 Document Revised: 04/19/2017 Document Reviewed: 04/19/2017 °Elsevier Patient Education © 2020 [employer] °Moderate Conscious Sedation, Adult, Care After °These instructions provide you with information about caring for yourself after your procedure. Your health care provider may also give you more specific instructions. Your treatment has been planned according to current medical practices, but problems sometimes occur. Call your health care provider if you have any problems or questions after your procedure. °What can I expect after the procedure? °After your procedure, it is common: °· To feel sleepy for several hours. °· To feel clumsy and have poor balance for several hours. °· To have poor judgment for   several hours. °· To vomit if you eat too soon. °Follow these instructions at home: °For at least 24 hours after the procedure: ° °· Do not: °? Participate in activities where you could fall or become injured. °? Drive. °? Use heavy machinery. °? Drink alcohol. °? Take sleeping pills or medicines that cause drowsiness. °? Make important decisions or sign legal documents. °? Take care of children on your own. °· Rest. °Eating and drinking °· Follow the diet recommended by your health care provider. °· If you vomit: °? Drink water, juice, or soup when you can drink without vomiting. °? Make sure you have little or no nausea before eating solid foods. °General instructions °· Have a responsible adult stay with you until you are awake and alert. °· Take over-the-counter and prescription medicines only as told by your health care provider. °· If you smoke, do not smoke without supervision. °· Keep all follow-up visits as told by  your health care provider. This is important. °Contact a health care provider if: °· You keep feeling nauseous or you keep vomiting. °· You feel light-headed. °· You develop a rash. °· You have a fever. °Get help right away if: °· You have trouble breathing. °This information is not intended to replace advice given to you by your health care provider. Make sure you discuss any questions you have with your health care provider. °Document Released: 01/28/2013 Document Revised: 03/22/2017 Document Reviewed: 07/30/2015 °Elsevier Patient Education © 2020 Elsevier Inc. ° °

## 2018-12-24 NOTE — H&P (Signed)
Chief Complaint: Patient was seen in consultation today for image guided random liver biopsy.  Referring Physician(s): Drazek,Dawn  Supervising Physician: Markus Daft  Patient Status: [facility] - Out-pt  History of Present Illness: Raymond Haynes is a 23 y.o. male with a past medical history significant for anxiety, HLD and autism who presents today for a random liver biopsy. Patient was previously on Abilify which resulted in significant weight gain, he was then transitioned to Depakote when he developed a fatty liver and severe elevation of his LFTs, he is now transitioning from Depakote to Davey because of this. He was seen in consultation with Dr. Laurence Ferrari on 12/18/18 regarding his elevated LFTs as there is concern that this may represent an underlying drug-induced hepatitis, steatohepatitis or autoimmune hepatitis. After this consultation the decision was made to proceed with an image guided random liver biopsy.  Kyris presents with his dad today, he denies any complaints and tells me that he's here today to "biopsy my belly." His dad states that he has been having some muscular cramping type pain in his buttocks that has been ongoing for several months and is being followed by their PCP, he states Jama has not had any other complaints lately. [name] and his father both state understanding of the procedure and wish to proceed  Past Medical History:  Diagnosis Date  . Anxiety   . Autism    difficulties with food textures, per mother  . Family history of adverse reaction to anesthesia    states father woke up during wisdom tooth extraction  . Fatty liver   . High cholesterol   . Impacted third molar tooth 12/2015  . Pale complexion    mother states is fair-skin and strawberry blond hair  . 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 IR RADIOLOGIST EVAL & MGMT  12/18/2018  . NO PAST SURGERIES    . TOOTH EXTRACTION N/A 01/04/2016   Procedure: SURGICAL REMOVAL OF IMPACTED THIRD MOLARS;  Surgeon: Jannette Fogo, DDS;  Location: [facility];  Service: Oral Surgery;  Laterality: N/A;    Allergies: Penicillins  Medications: Prior to Admission medications   Medication Sig Start Date End Date Taking? Authorizing Provider  cholecalciferol (VITAMIN D) 1000 units tablet Take 1,000 Units by mouth daily.   Yes [provider]  divalproex (DEPAKOTE) 250 MG DR tablet Take 250 mg by mouth daily.   Yes [provider]  lurasidone (LATUDA) 20 MG TABS tablet Take 20 mg by mouth 2 (two) times daily.   Yes [provider]  Multiple Vitamin (MULTIVITAMIN) tablet Take 1 tablet by mouth daily.   Yes [provider]  risperiDONE (RISPERDAL) 0.5 MG tablet Take 0.5-2 mg by mouth daily as needed (calming).   Yes [provider]  topiramate (TOPAMAX) 25 MG capsule Take 25 mg by mouth 2 (two) times daily.   Yes [provider]  vitamin B-12 (CYANOCOBALAMIN) 1000 MCG tablet Take 1,000 mcg by mouth daily.    Yes [provider]     Family History  Problem Relation Age of Onset  . Anesthesia problems Father        woke up during wisdom tooth extraction  . Depression Mother   . Anxiety disorder Mother   . ADD / ADHD Paternal Uncle   . Autism spectrum disorder Paternal Uncle   . Migraines Neg Hx   . Seizures Neg Hx   . Bipolar disorder  Neg Hx   . Schizophrenia Neg Hx     Social History   Socioeconomic History  . Marital status: Single    Spouse name: Not on file  . Number of children: Not on file  . Years of education: Not on file  . Highest education level: Not on file  Occupational History  . Not on file  Social Needs  . Financial resource strain: Not on file  . Food insecurity    Worry: Not on file    Inability: Not on file  . Transportation needs    Medical: Not on file    Non-medical: Not on file  Tobacco Use  . Smoking status: Never Smoker  .  Smokeless tobacco: Never Used  Substance and Sexual Activity  . Alcohol use: No  . Drug use: No  . Sexual activity: Not on file  Lifestyle  . Physical activity    Days per week: Not on file    Minutes per session: Not on file  . Stress: Not on file  Relationships  . Social Herbalist on phone: Not on file    Gets together: Not on file    Attends religious service: Not on file    Active member of club or organization: Not on file    Attends meetings of clubs or organizations: Not on file    Relationship status: Not on file  Other Topics Concern  . Not on file  Social History Narrative   Raymond Haynes graduated from [employer]; he volunteers at [employer] and [employer]. He is waiting on a job at [employer], currently he is volunterring. He lives with his parents and sibling. He enjoys going out to restaurants, watching movies, and listening to music.       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      Review of Systems: A 12 point ROS discussed and pertinent positives are indicated in the HPI above.  All other systems are negative.  Review of Systems  Constitutional: Negative for chills and fever.  Respiratory: Negative for cough and shortness of breath.   Cardiovascular: Negative for chest pain.  Gastrointestinal: Negative for abdominal pain, blood in stool, diarrhea, nausea and vomiting.  Genitourinary: Negative for dysuria and hematuria.  Musculoskeletal: Positive for arthralgias (cramping type pain in buttocks). Negative for back pain.  Skin: Negative for color change and rash.  Neurological: Negative for dizziness and headaches.    Vital Signs: BP (!) 143/103   Pulse 69   Temp (!) 97.5 F (36.4 C) (Oral)   Resp 16   Ht 5\' 10"  (1.778 m)   Wt 225 lb (102.1 kg)   SpO2 100%   BMI 32.28 kg/m   Physical Exam Vitals signs reviewed.  Constitutional:      General: He is not in acute distress.    Comments: Pleasant, answers questions appropriately   HENT:     Head: Normocephalic.     Mouth/Throat:     Mouth: Mucous membranes are moist.     Pharynx: Oropharynx is clear. No oropharyngeal exudate or posterior oropharyngeal erythema.  Cardiovascular:     Rate and Rhythm: Normal rate and regular rhythm.  Pulmonary:     Effort: Pulmonary effort is normal.     Breath sounds: Normal breath sounds.  Abdominal:     General: Bowel sounds are normal. There is no distension.     Palpations: Abdomen is soft.     Tenderness: There is no abdominal tenderness.  Skin:    General: Skin is warm and dry.     Coloration: Skin is not jaundiced.  Neurological:     Mental Status: He is alert and oriented to person, place, and time.  Psychiatric:        Mood and Affect: Mood normal.        Behavior: Behavior normal.        Thought Content: Thought content normal.        Judgment: Judgment normal.      MD Evaluation Airway: WNL Heart: WNL Abdomen: WNL Chest/ Lungs: WNL ASA  Classification: 2 Mallampati/Airway Score: Two   Imaging: Ir Radiologist Eval & Mgmt  Result Date: 12/18/2018 Please refer to notes tab for details about interventional procedure. (Op Note)  US Abdomen Limited Ruq  Result Date: 12/16/2018 CLINICAL DATA:  Elevated liver enzymes EXAM: ULTRASOUND ABDOMEN LIMITED RIGHT UPPER QUADRANT COMPARISON:  None. FINDINGS: Gallbladder: No gallstones or wall thickening visualized. There is no pericholecystic fluid. No sonographic Murphy sign noted by sonographer. Common bile duct: Diameter: 7 mm, upper normal. No intrahepatic or extrahepatic biliary duct dilatation evident. Note that portions of the common bile duct are obscured by gas. Liver: No focal lesion identified. Liver echogenicity is increased diffusely. Portal vein is patent on color Doppler imaging with normal direction of blood flow towards the liver. Other: None. IMPRESSION: 1. Increase in liver echogenicity diffusely, a finding indicative of hepatic steatosis. No focal liver  lesions evident. 2. Portions of the common bile duct obscured by gas. Visualized portions of the common bile duct are at the upper limits of normal. No intrahepatic biliary duct dilatation evident. 3.  No demonstrable gallbladder pathology. Electronically Signed   By: Lowella Grip III M.D.   On: 12/16/2018 11:09    Labs:  CBC: No results for input(s): WBC, HGB, HCT, PLT in the last 8760 hours.  COAGS: Recent Labs    12/24/18 0659  INR 1.0  APTT 32    BMP: No results for input(s): NA, K, CL, CO2, GLUCOSE, BUN, CALCIUM, CREATININE, GFRNONAA, GFRAA in the last 8760 hours.  Invalid input(s): CMP  LIVER FUNCTION TESTS: No results for input(s): BILITOT, AST, ALT, ALKPHOS, PROT, ALBUMIN in the last 8760 hours.  TUMOR MARKERS: No results for input(s): AFPTM, CEA, CA199, CHROMGRNA in the last 8760 hours.  Assessment and Plan:  23 y/o M with history of autism currently taking Topamax, Depakote and Latuda who presents today for an image guided random liver biopsy to evaluate elevated LFTs. Last CMP available in Care Everywhere dated 12/11/18 significant for albumin 5.2, AST 215, ALT 335, ALP 68.   Patient has been NPO since 7 pm last night, he does not take blood thinning medications. Afebrile, WBC 7.4, hgb 14.4, plt 269, INR 1.0.  Risks and benefits of random liver biopsy was discussed with the patient and/or patient's family including, but not limited to bleeding, infection, damage to adjacent structures or low yield requiring additional tests.  All of the questions were answered and there is agreement to proceed.  Consent signed and in chart.  Thank you for this interesting consult.  I greatly enjoyed meeting Noah Quincey and look forward to participating in their care.  A copy of this report was sent to the requesting provider on this date.  Electronically Signed: Joaquim [name], PA-C 12/24/2018, 7:24 AM   I spent a total of  15 Minutes  in face to face in clinical  consultation, greater than 50% of which was  counseling/coordinating care for random liver biopsy.

## 2019-09-16 IMAGING — US US BIOPSY CORE LIVER
1 series · 10 of 10 positions shown · non-contrast
Comparison: none

INDICATION: 22-year-old with abnormal liver enzymes. Evaluate for steatosis,
drug-induced toxicity or autoimmune hepatitis.

[Series 1: us biopsy core liver · 10 acquisitions, 10 frames shown]
[im 1/10]
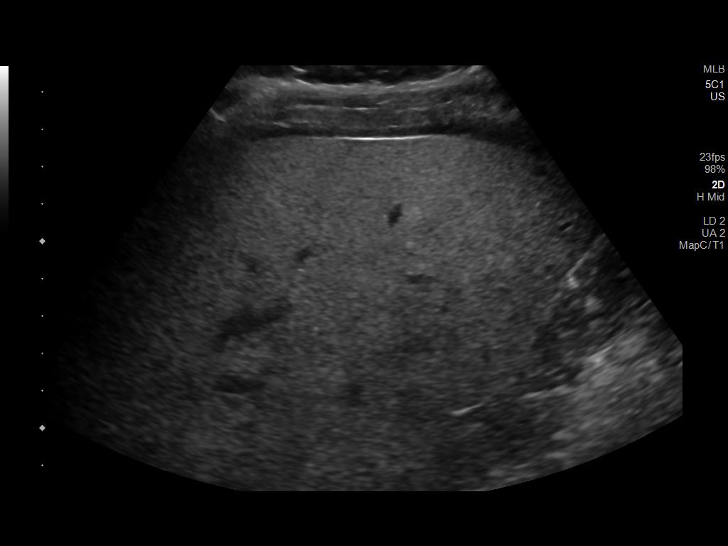
[im 2/10]
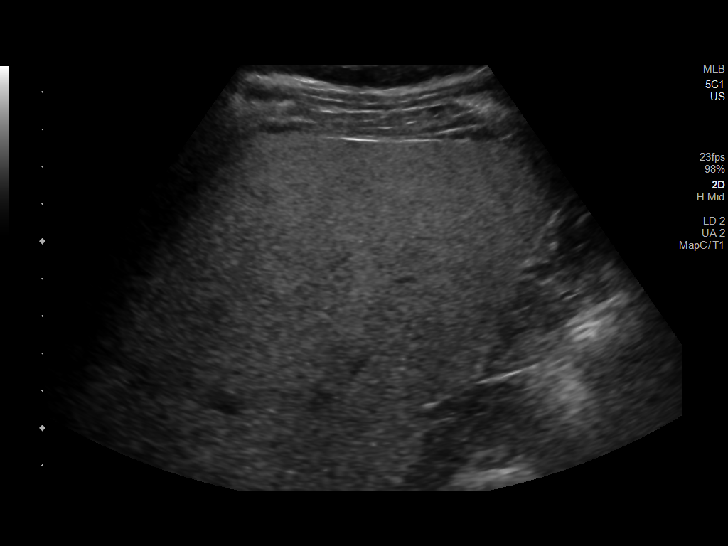
[im 3/10]
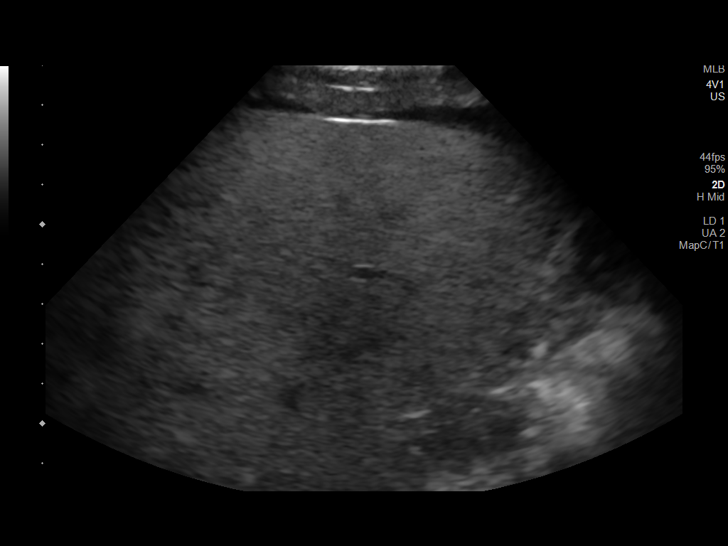
[im 4/10]
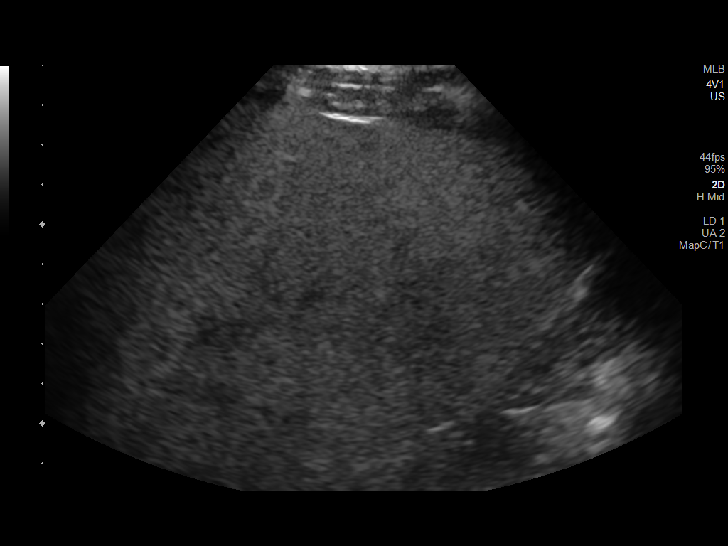
[im 5/10]
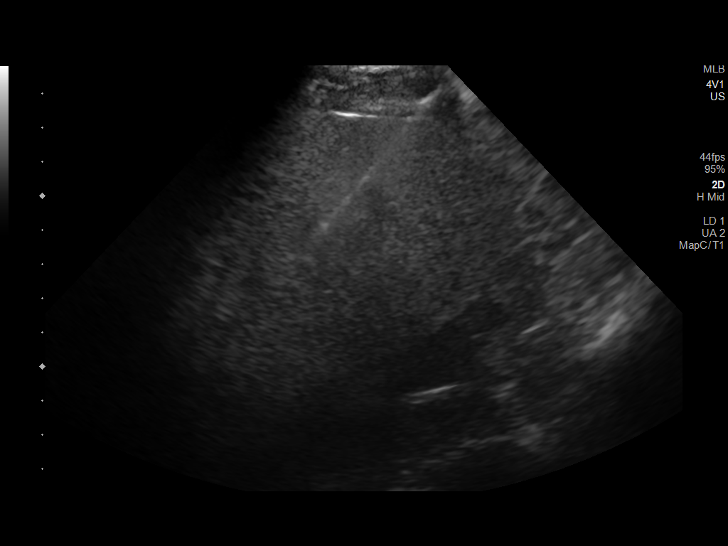
[im 6/10]
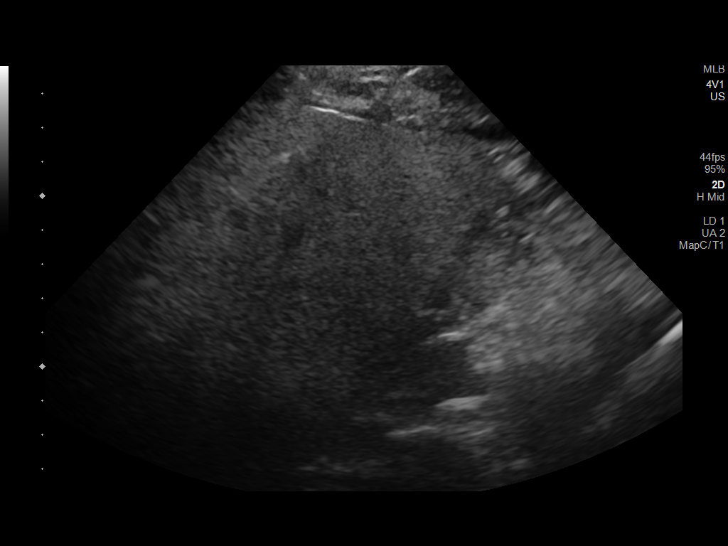
[im 7/10]
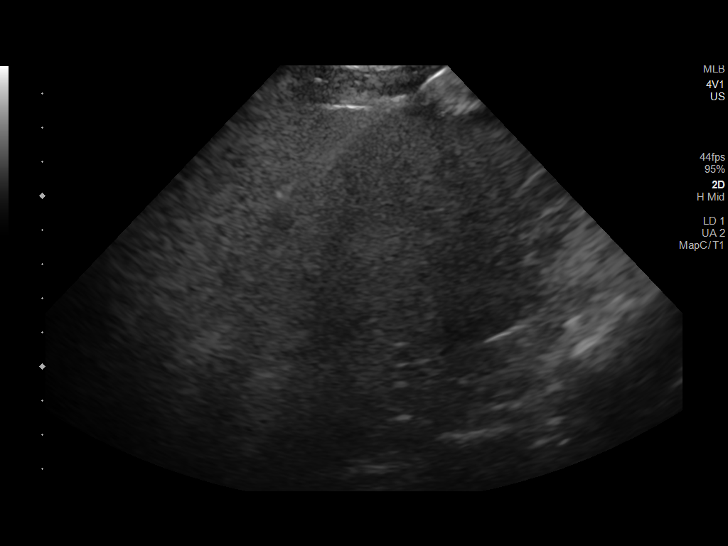
[im 8/10]
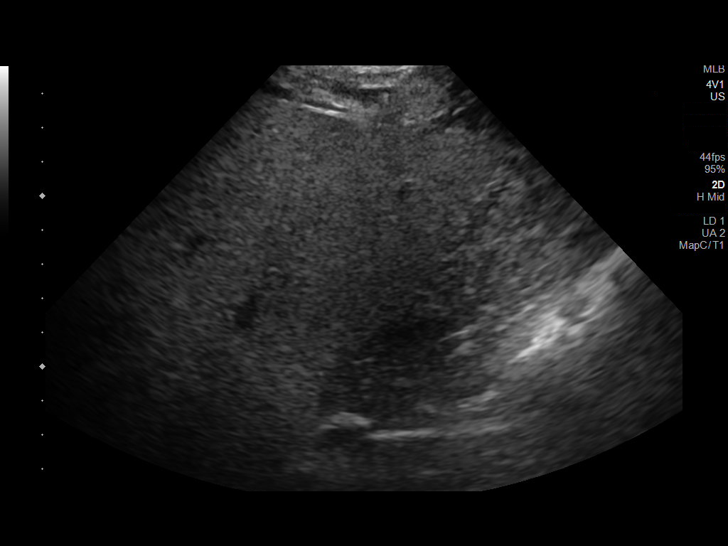
[im 9/10]
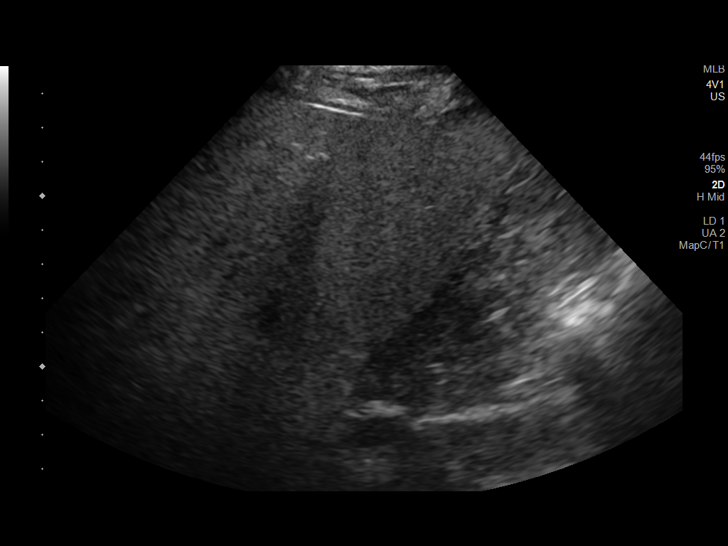
[im 10/10]
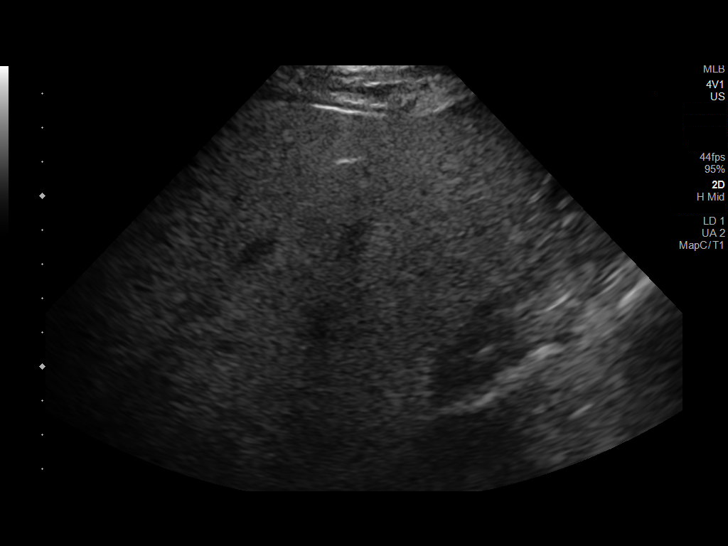

[10 of 10 positions shown; findings below may reference images not displayed]

EXAM:
ULTRASOUND-GUIDED LIVER BIOPSY

MEDICATIONS:
None.

ANESTHESIA/SEDATION:
Moderate (conscious) sedation was employed during this procedure. A
total of Versed 1 mg and Fentanyl 50 mcg was administered
intravenously.

Moderate Sedation Time: 16 minutes. The patient's level of
consciousness and vital signs were monitored continuously by
radiology nursing throughout the procedure under my direct
supervision.

FLUOROSCOPY TIME:  None

COMPLICATIONS:
None immediate.

PROCEDURE:
Informed written consent was obtained from the patient and father
after a thorough discussion of the procedural risks, benefits and
alternatives. All questions were addressed. Maximal Sterile Barrier
Technique was utilized including caps, mask, sterile gowns, sterile
gloves, sterile drape, hand hygiene and skin antiseptic. A timeout
was performed prior to the initiation of the procedure.

Liver was evaluated with ultrasound. The right hepatic lobe was
targeted for biopsy. Right side of the abdomen was prepped with
chlorhexidine and sterile field was created. Skin and soft tissues
were anesthetized with 1% lidocaine. Using ultrasound guidance, 17
gauge coaxial needle was directed in the right hepatic lobe from a
subcostal approach. Needle position confirmed within liver. Three
core biopsies obtained with an 18 gauge device. Specimens placed in
formalin. Gel-Foam slurry was injected through the 17 gauge needle
as it was removed. Bandage placed over the puncture site.
FINDINGS: The liver is heterogeneous and echogenic. Findings are suggestive
for steatosis. Three adequate core biopsies obtained. No bleeding or
hematoma around the liver following the core biopsies.
IMPRESSION: Ultrasound-guided core biopsies of the right hepatic lobe.
